# Patient Record
Sex: Male | Born: 1970 | Race: White | Hispanic: No | Marital: Married | Smoking: Never smoker
Health system: Southern US, Community
[De-identification: ages and names within clinical notes are randomized; demographics above are authoritative.]

## PROBLEM LIST (undated history)

## (undated) DIAGNOSIS — F909 Attention-deficit hyperactivity disorder, unspecified type: Secondary | ICD-10-CM

## (undated) HISTORY — DX: Attention-deficit hyperactivity disorder, unspecified type: F90.9

---

## 2017-09-07 DIAGNOSIS — F900 Attention-deficit hyperactivity disorder, predominantly inattentive type: Secondary | ICD-10-CM | POA: Diagnosis not present

## 2017-09-07 DIAGNOSIS — F411 Generalized anxiety disorder: Secondary | ICD-10-CM | POA: Diagnosis not present

## 2017-09-19 MED FILL — FLUoxetine HCL 20 MG CAPS: 20 | 90 days supply | Qty: 90 | Fill #0

## 2017-09-19 MED FILL — DEXTROAMP-AMPHETAMIN 20 MG: 20 | 30 days supply | Qty: 60 | Fill #0

## 2017-11-18 MED FILL — DEXTROAMP-AMPHETAMIN 20 MG: 20 | 30 days supply | Qty: 60 | Fill #0

## 2017-12-26 MED FILL — DEXTROAMP-AMPHETAMIN 20 MG: 20 | 30 days supply | Qty: 60 | Fill #0

## 2017-12-27 MED FILL — FLUoxetine HCL 20 MG CAPS: 20 | 90 days supply | Qty: 90 | Fill #1

## 2018-03-07 MED FILL — DEXTROAMP-AMPHETAMIN 20 MG: 20 | 30 days supply | Qty: 60 | Fill #0

## 2018-04-07 MED FILL — DEXTROAMP-AMPHETAMIN 20 MG: 20 | 30 days supply | Qty: 60 | Fill #0

## 2018-05-08 MED FILL — DEXTROAMP-AMPHETAMIN 20 MG: 20 | 30 days supply | Qty: 60 | Fill #0

## 2018-06-06 DIAGNOSIS — F411 Generalized anxiety disorder: Secondary | ICD-10-CM | POA: Diagnosis not present

## 2018-06-06 DIAGNOSIS — F900 Attention-deficit hyperactivity disorder, predominantly inattentive type: Secondary | ICD-10-CM | POA: Diagnosis not present

## 2018-06-09 MED FILL — DEXTROAMP-AMPHETAMIN 20 MG: 20 | 30 days supply | Qty: 60 | Fill #0

## 2018-07-11 MED FILL — AMPHETAMINE-DEXTROAMPHETAMI: 20 | 30 days supply | Qty: 60 | Fill #0

## 2018-09-22 ENCOUNTER — Encounter: Payer: Self-pay | Admitting: Physician Assistant

## 2018-09-22 ENCOUNTER — Other Ambulatory Visit: Payer: Self-pay

## 2018-09-22 ENCOUNTER — Ambulatory Visit (INDEPENDENT_AMBULATORY_CARE_PROVIDER_SITE_OTHER): Payer: 59 | Admitting: Physician Assistant

## 2018-09-22 VITALS — BP 162/88 | HR 93 | Temp 98.5°F | Resp 16 | Ht 74.0 in | Wt 211.0 lb

## 2018-09-22 DIAGNOSIS — Z136 Encounter for screening for cardiovascular disorders: Secondary | ICD-10-CM | POA: Diagnosis not present

## 2018-09-22 DIAGNOSIS — I10 Essential (primary) hypertension: Secondary | ICD-10-CM | POA: Insufficient documentation

## 2018-09-22 DIAGNOSIS — E663 Overweight: Secondary | ICD-10-CM | POA: Diagnosis not present

## 2018-09-22 DIAGNOSIS — Z13220 Encounter for screening for lipoid disorders: Secondary | ICD-10-CM | POA: Diagnosis not present

## 2018-09-22 DIAGNOSIS — F909 Attention-deficit hyperactivity disorder, unspecified type: Secondary | ICD-10-CM | POA: Insufficient documentation

## 2018-09-22 DIAGNOSIS — Z0001 Encounter for general adult medical examination with abnormal findings: Secondary | ICD-10-CM

## 2018-09-22 MED ORDER — AMLODIPINE BESYLATE 5 MG PO TABS: 5.0000 mg | ORAL_TABLET | Freq: Every day | ORAL | 0 refills | Status: DC

## 2018-09-22 MED FILL — AMLODIPINE BESYLATE 5 MG TA: 5 | 30 days supply | Qty: 30 | Fill #0

## 2018-09-22 NOTE — Progress Notes (Signed)
Subjective:    Robert [name] is a 48 y.o. male and is here for a comprehensive physical exam.  HPI  Health Maintenance Due  Topic Date Due  . HIV Screening  01/23/1986    Acute Concerns: Elevated blood pressure -- Currently taking not on medications. At home blood pressure readings are: 158/96. Patient denies chest pain, SOB, blurred vision, dizziness, unusual headaches, lower leg swelling. Denies excessive caffeine intake, excessive alcohol intake, or increase in salt consumption. He does take Adderall 10 mg BID. Significant fam hx of HTN, stroke, MI.  Chronic Issues: ADHD -- taking Adderall 10 mg BID. Managed by [facility].  Health Maintenance: Immunizations -- UTD Colonoscopy -- n/a PSA -- n/a Diet -- well balanced Caffeine intake -- 1-2 a day Exercise -- runs a few times a week as knee allows, strength training Weight -- Weight: 211 lb (95.7 kg)  Weight history Wt Readings from Last 10 Encounters:  09/22/18 211 lb (95.7 kg)   Mood -- good Tobacco use -- none Alcohol use --- socially, no concern for intake  Depression screen PHQ 2/9 09/22/2018  Decreased Interest 0  Down, Depressed, Hopeless 0  PHQ - 2 Score 0     Other providers/specialists: [name], [name]   PMHx, SurgHx, SocialHx, Medications, and Allergies were reviewed in the Visit Navigator and updated as appropriate.   History reviewed. No pertinent past medical history.  History reviewed. No pertinent surgical history.  History reviewed. No pertinent family history.  Social History   Tobacco Use  . Smoking status: Never Smoker  . Smokeless tobacco: Never Used  Substance Use Topics  . Alcohol use: Not on file  . Drug use: Not on file    Review of Systems:   Review of Systems  Constitutional: Negative for chills, fever, malaise/fatigue and weight loss.  HENT: Negative for hearing loss, sinus pain and sore throat.    Respiratory: Negative for cough and hemoptysis.   Cardiovascular: Negative for chest pain, palpitations, leg swelling and PND.  Gastrointestinal: Negative for abdominal pain, constipation, diarrhea, heartburn, nausea and vomiting.  Genitourinary: Negative for dysuria, frequency and urgency.  Musculoskeletal: Negative for back pain, myalgias and neck pain.  Skin: Negative for itching and rash.  Neurological: Negative for dizziness, tingling, seizures and headaches.  Endo/Heme/Allergies: Negative for polydipsia.  Psychiatric/Behavioral: Negative for depression. The patient is not nervous/anxious.     Objective:   Vitals:   09/22/18 1446  BP: (!) 162/88  Pulse: 93  Resp: 16  Temp: 98.5 F (36.9 C)  SpO2: 97%   Body mass index is 27.09 kg/m.  General Appearance:  Alert, cooperative, no distress, appears stated age  Head:  Normocephalic, without obvious abnormality, atraumatic  Eyes:  PERRL, conjunctiva/corneas clear, EOM's intact, fundi benign, both eyes       Ears:  Normal TM's and external ear canals, both ears  Nose: Nares normal, septum midline, mucosa normal, no drainage    or sinus tenderness  Throat: Lips, mucosa, and tongue normal; teeth and gums normal  Neck: Supple, symmetrical, trachea midline, no adenopathy; thyroid:  No enlargement/tenderness/nodules; no carotit bruit or JVD  Back:   Symmetric, no curvature, ROM normal, no CVA tenderness  Lungs:   Clear to auscultation bilaterally, respirations unlabored  Chest wall:  No tenderness or deformity  Heart:  Regular rate and rhythm, S1 and S2 normal, no murmur, rub   or gallop  Abdomen:   Soft,  non-tender, bowel sounds active all four quadrants, no masses, no organomegaly  Extremities: Extremities normal, atraumatic, no cyanosis or edema  Prostate: Not done.   Skin: Skin color, texture, turgor normal, no rashes or lesions  Lymph nodes: Cervical, supraclavicular, and axillary nodes normal  Neurologic: CNII-XII grossly  intact. Normal strength, sensation and reflexes throughout   No results found for this or any previous visit.  Assessment/Plan:   Robert [name] was seen today for establish care, annual exam, prehypertension and adhd.  Diagnoses and all orders for this visit:  Encounter for general adult medical examination with abnormal findings Today patient counseled on age appropriate routine health concerns for screening and prevention, each reviewed and up to date or declined. Immunizations reviewed and up to date or declined. Labs ordered and reviewed. Risk factors for depression reviewed and negative. Hearing function and visual acuity are intact. ADLs screened and addressed as needed. Functional ability and level of safety reviewed and appropriate. Education, counseling and referrals performed based on assessed risks today. Patient provided with a copy of personalized plan for preventive services.  Hypertension, unspecified type Uncontrolled. Start Norvasc 5mg . Start BP log. Follow-up in 2 weeks, sooner if concerns. -     CBC with Differential/Platelet; Future -     Comprehensive metabolic panel; Future -     TSH; Future  Attention deficit hyperactivity disorder (ADHD), unspecified ADHD type Stable per patient. May need to consider adjustment (per provider) if BP issues persist despite anti-HTN treatment.  Overweight Continue to work on increasing exercise and better food choices.  Encounter for lipid screening for cardiovascular disease -     Cancel: Lipid panel -     Lipid panel; Future    Well Adult Exam: Labs ordered: Yes. Patient counseling was done. See below for items discussed. Discussed the patient's BMI.  The BMI is not in the acceptable range; BMI management plan is completed Follow up in 2 weeks.  Patient Counseling: [x]   Nutrition: Stressed importance of moderation in sodium/caffeine intake, saturated fat and cholesterol, caloric balance, sufficient intake of fresh fruits,  vegetables, and fiber.  [x]   Stressed the importance of regular exercise.   []   Substance Abuse: Discussed cessation/primary prevention of tobacco, alcohol, or other drug use; driving or other dangerous activities under the influence; availability of treatment for abuse.   [x]   Injury prevention: Discussed safety belts, safety helmets, smoke detector, smoking near bedding or upholstery.   []   Sexuality: Discussed sexually transmitted diseases, partner selection, use of condoms, avoidance of unintended pregnancy  and contraceptive alternatives.   [x]   Dental health: Discussed importance of regular tooth brushing, flossing, and dental visits.  [x]   Health maintenance and immunizations reviewed. Please refer to Health maintenance section.   Inda Coke, PA-C [place]

## 2018-09-22 NOTE — Patient Instructions (Addendum)
It was great to see you!  Please go to the lab for blood work.   Follow-up with me in TWO weeks for blood pressure. Keep a log. Start Norvasc tomorrow.  Our office will call you with your results unless you have chosen to receive results via MyChart.  If your blood work is normal we will follow-up each year for physicals and as scheduled for chronic medical problems.  If anything is abnormal we will treat accordingly and get you in for a follow-up.  Take care,  [facility] Maintenance, Male Adopting a healthy lifestyle and getting preventive care are important in promoting health and wellness. Ask your health care provider about:  The right schedule for you to have regular tests and exams.  Things you can do on your own to prevent diseases and keep yourself healthy. What should I know about diet, weight, and exercise? Eat a healthy diet   Eat a diet that includes plenty of vegetables, fruits, low-fat dairy products, and lean protein.  Do not eat a lot of foods that are high in solid fats, added sugars, or sodium. Maintain a healthy weight Body mass index (BMI) is a measurement that can be used to identify possible weight problems. It estimates body fat based on height and weight. Your health care provider can help determine your BMI and help you achieve or maintain a healthy weight. Get regular exercise Get regular exercise. This is one of the most important things you can do for your health. Most adults should:  Exercise for at least 150 minutes each week. The exercise should increase your heart rate and make you sweat (moderate-intensity exercise).  Do strengthening exercises at least twice a week. This is in addition to the moderate-intensity exercise.  Spend less time sitting. Even light physical activity can be beneficial. Watch cholesterol and blood lipids Have your blood tested for lipids and cholesterol at 49 years of age, then have this test every 5 years.  You may need to have your cholesterol levels checked more often if:  Your lipid or cholesterol levels are high.  You are older than 49 years of age.  You are at high risk for heart disease. What should I know about cancer screening? Many types of cancers can be detected early and may often be prevented. Depending on your health history and family history, you may need to have cancer screening at various ages. This may include screening for:  Colorectal cancer.  Prostate cancer.  Skin cancer.  Lung cancer. What should I know about heart disease, diabetes, and high blood pressure? Blood pressure and heart disease  High blood pressure causes heart disease and increases the risk of stroke. This is more likely to develop in people who have high blood pressure readings, are of African descent, or are overweight.  Talk with your health care provider about your target blood pressure readings.  Have your blood pressure checked: ? Every 3-5 years if you are 17-13 years of age. ? Every year if you are 50 years old or older.  If you are between the ages of 40 and 43 and are a current or former smoker, ask your health care provider if you should have a one-time screening for abdominal aortic aneurysm (AAA). Diabetes Have regular diabetes screenings. This checks your fasting blood sugar level. Have the screening done:  Once every three years after age 45 if you are at a normal weight and have a low risk for diabetes.  More often and at a younger age if you are overweight or have a high risk for diabetes. What should I know about preventing infection? Hepatitis B If you have a higher risk for hepatitis B, you should be screened for this virus. Talk with your health care provider to find out if you are at risk for hepatitis B infection. Hepatitis C Blood testing is recommended for:  Everyone born from [birth year] through 1966.  Anyone with known risk factors for hepatitis C. Sexually  transmitted infections (STIs)  You should be screened each year for STIs, including gonorrhea and chlamydia, if: ? You are sexually active and are younger than 49 years of age. ? You are older than 49 years of age and your health care provider tells you that you are at risk for this type of infection. ? Your sexual activity has changed since you were last screened, and you are at increased risk for chlamydia or gonorrhea. Ask your health care provider if you are at risk.  Ask your health care provider about whether you are at high risk for HIV. Your health care provider may recommend a prescription medicine to help prevent HIV infection. If you choose to take medicine to prevent HIV, you should first get tested for HIV. You should then be tested every 3 months for as long as you are taking the medicine. Follow these instructions at home: Lifestyle  Do not use any products that contain nicotine or tobacco, such as cigarettes, e-cigarettes, and chewing tobacco. If you need help quitting, ask your health care provider.  Do not use street drugs.  Do not share needles.  Ask your health care provider for help if you need support or information about quitting drugs. Alcohol use  Do not drink alcohol if your health care provider tells you not to drink.  If you drink alcohol: ? Limit how much you have to 0-2 drinks a day. ? Be aware of how much alcohol is in your drink. In the [place] one drink equals one 12 oz bottle of beer (355 mL), one 5 oz glass of wine (148 mL), or one 1 oz glass of hard liquor (44 mL). General instructions  Schedule regular health, dental, and eye exams.  Stay current with your vaccines.  Tell your health care provider if: ? You often feel depressed. ? You have ever been abused or do not feel safe at home. Summary  Adopting a healthy lifestyle and getting preventive care are important in promoting health and wellness.  Follow your health care provider's  instructions about healthy diet, exercising, and getting tested or screened for diseases.  Follow your health care provider's instructions on monitoring your cholesterol and blood pressure. This information is not intended to replace advice given to you by your health care provider. Make sure you discuss any questions you have with your health care provider. Document Released: 08/28/2007 Document Revised: 02/22/2018 Document Reviewed: 02/22/2018 Elsevier Patient Education  2020 [employer].

## 2018-09-23 LAB — COMPREHENSIVE METABOLIC PANEL
AG Ratio: 1.9 (calc) (ref 1.0–2.5)
ALT: 43 U/L (ref 9–46)
AST: 34 U/L (ref 10–40)
Albumin: 4.6 g/dL (ref 3.6–5.1)
Alkaline phosphatase (APISO): 41 U/L (ref 36–130)
BUN: 17 mg/dL (ref 7–25)
CO2: 28 mmol/L (ref 20–32)
Calcium: 9.7 mg/dL (ref 8.6–10.3)
Chloride: 103 mmol/L (ref 98–110)
Creat: 1.07 mg/dL (ref 0.60–1.35)
Globulin: 2.4 g/dL (calc) (ref 1.9–3.7)
Glucose, Bld: 101 mg/dL — ABNORMAL HIGH (ref 65–99)
Potassium: 3.6 mmol/L (ref 3.5–5.3)
Sodium: 140 mmol/L (ref 135–146)
Total Bilirubin: 0.6 mg/dL (ref 0.2–1.2)
Total Protein: 7 g/dL (ref 6.1–8.1)

## 2018-09-23 LAB — CBC WITH DIFFERENTIAL/PLATELET
Absolute Monocytes: 674 cells/uL (ref 200–950)
Basophils Absolute: 32 cells/uL (ref 0–200)
Basophils Relative: 0.5 %
Eosinophils Absolute: 113 cells/uL (ref 15–500)
Eosinophils Relative: 1.8 %
HCT: 45.8 % (ref 38.5–50.0)
Hemoglobin: 15.8 g/dL (ref 13.2–17.1)
Lymphs Abs: 1046 cells/uL (ref 850–3900)
MCH: 32.7 pg (ref 27.0–33.0)
MCHC: 34.5 g/dL (ref 32.0–36.0)
MCV: 94.8 fL (ref 80.0–100.0)
MPV: 12 fL (ref 7.5–12.5)
Monocytes Relative: 10.7 %
Neutro Abs: 4435 cells/uL (ref 1500–7800)
Neutrophils Relative %: 70.4 %
Platelets: 213 10*3/uL (ref 140–400)
RBC: 4.83 10*6/uL (ref 4.20–5.80)
RDW: 13.1 % (ref 11.0–15.0)
Total Lymphocyte: 16.6 %
WBC: 6.3 10*3/uL (ref 3.8–10.8)

## 2018-09-23 LAB — LIPID PANEL
Cholesterol: 199 mg/dL (ref <200)
HDL: 39 mg/dL — ABNORMAL LOW (ref >=40)
LDL Cholesterol (Calc): 131 mg/dL (calc) — ABNORMAL HIGH
Non-HDL Cholesterol (Calc): 160 mg/dL (calc) — ABNORMAL HIGH (ref <130)
Total CHOL/HDL Ratio: 5.1 (calc) — ABNORMAL HIGH (ref <5.0)
Triglycerides: 159 mg/dL — ABNORMAL HIGH (ref <150)

## 2018-09-23 LAB — TSH: TSH: 2.05 mIU/L (ref 0.40–4.50)

## 2018-09-24 ENCOUNTER — Encounter: Payer: Self-pay | Admitting: Physician Assistant

## 2018-09-26 DIAGNOSIS — F411 Generalized anxiety disorder: Secondary | ICD-10-CM | POA: Diagnosis not present

## 2018-09-26 DIAGNOSIS — F900 Attention-deficit hyperactivity disorder, predominantly inattentive type: Secondary | ICD-10-CM | POA: Diagnosis not present

## 2018-09-28 MED FILL — AMPHETAMINE-DEXTROAMPHETAMI: 20 | 30 days supply | Qty: 60 | Fill #0

## 2018-10-06 ENCOUNTER — Other Ambulatory Visit: Payer: Self-pay

## 2018-10-06 ENCOUNTER — Encounter: Payer: Self-pay | Admitting: Physician Assistant

## 2018-10-06 ENCOUNTER — Ambulatory Visit: Payer: 59 | Admitting: Physician Assistant

## 2018-10-06 VITALS — BP 142/90 | HR 88 | Temp 98.4°F | Ht 74.0 in | Wt 210.0 lb

## 2018-10-06 DIAGNOSIS — I10 Essential (primary) hypertension: Secondary | ICD-10-CM | POA: Diagnosis not present

## 2018-10-06 MED ORDER — AMLODIPINE BESYLATE 10 MG PO TABS: 10.0000 mg | ORAL_TABLET | Freq: Every day | ORAL | 1 refills | Status: DC

## 2018-10-06 NOTE — Progress Notes (Signed)
Robert [name] is a 48 y.o. male is for follow up on HTN  I acted as a Education administrator for [employer], PA-C Anselmo Pickler, LPN  History of Present Illness:   Chief Complaint  Patient presents with  . Hypertension    HPI   Hypertension Pt for follow up today for blood pressure. He has not been checking blood pressure at home has a monitor on order. Currently taking Amlodipine 5 mg daily. Pt denies headaches, dizziness, blurred vision, chest pain, SOB. Pt noted LLE on Sunday night after moving all weekend.Pt elevated legs and was gone by moring.  Denies excessive caffeine intake, stimulant usage, excessive alcohol intake or increase in salt consumption.  BP Readings from Last 3 Encounters:  10/06/18 (!) 142/90  09/22/18 (!) 162/88     Health Maintenance Due  Topic Date Due  . HIV Screening  01/23/1986    Past Medical History:  Diagnosis Date  . ADHD      Social History   Socioeconomic History  . Marital status: Married    Spouse name: Not on file  . Number of children: Not on file  . Years of education: Not on file  . Highest education level: Not on file  Occupational History  . Not on file  Social Needs  . Financial resource strain: Not on file  . Food insecurity    Worry: Not on file    Inability: Not on file  . Transportation needs    Medical: Not on file    Non-medical: Not on file  Tobacco Use  . Smoking status: Never Smoker  . Smokeless tobacco: Never Used  Substance and Sexual Activity  . Alcohol use: Yes  . Drug use: Never  . Sexual activity: Yes    Partners: Female  Lifestyle  . Physical activity    Days per week: Not on file    Minutes per session: Not on file  . Stress: Not on file  Relationships  . Social Herbalist on phone: Not on file    Gets together: Not on file    Attends religious service: Not on file    Active member of club or organization: Not on file    Attends meetings of clubs or organizations: Not on file     Relationship status: Not on file  . Intimate partner violence    Fear of current or ex partner: Not on file    Emotionally abused: Not on file    Physically abused: Not on file    Forced sexual activity: Not on file  Other Topics Concern  . Not on file  Social History Narrative  . Not on file    History reviewed. No pertinent surgical history.  Family History  Problem Relation Age of Onset  . High blood pressure Mother   . Stroke Mother   . High blood pressure Father   . Heart disease Father   . Heart disease Maternal Grandfather   . High blood pressure Maternal Grandfather   . Heart disease Paternal Grandfather   . High blood pressure Paternal Grandfather     PMHx, SurgHx, SocialHx, FamHx, Medications, and Allergies were reviewed in the Visit Navigator and updated as appropriate.   Patient Active Problem List   Diagnosis Date Noted  . Hypertension 09/22/2018  . Attention deficit hyperactivity disorder (ADHD) 09/22/2018    Social History   Tobacco Use  . Smoking status: Never Smoker  . Smokeless tobacco: Never Used  Substance Use Topics  . Alcohol use: Yes  . Drug use: Never    Current Medications and Allergies:    Current Outpatient Medications:  .  amphetamine-dextroamphetamine (ADDERALL) 10 MG tablet, Take 1 tablet by mouth 2 (two) times a day. , Disp: , Rfl:  .  amLODipine (NORVASC) 10 MG tablet, Take 1 tablet (10 mg total) by mouth daily., Disp: 30 tablet, Rfl: 1  No Known Allergies  Review of Systems   ROS Negative unless otherwise specified per HPI.  Vitals:   Vitals:   10/06/18 1424 10/06/18 1611  BP: 138/90 (!) 142/90  Pulse: 88   Temp: 98.4 F (36.9 C)   TempSrc: Oral   SpO2: 98%   Weight: 210 lb (95.3 kg)   Height: 6\' 2"  (1.88 m)      Body mass index is 26.96 kg/m.   Physical Exam:    Physical Exam Vitals signs and nursing note reviewed.  Constitutional:      General: He is not in acute distress.    Appearance: He is  well-developed. He is not ill-appearing or toxic-appearing.  Cardiovascular:     Rate and Rhythm: Normal rate and regular rhythm.     Pulses: Normal pulses.     Heart sounds: Normal heart sounds, S1 normal and S2 normal.     Comments: No LE edema Pulmonary:     Effort: Pulmonary effort is normal.     Breath sounds: Normal breath sounds.  Skin:    General: Skin is warm and dry.  Neurological:     Mental Status: He is alert.     GCS: GCS eye subscore is 4. GCS verbal subscore is 5. GCS motor subscore is 6.  Psychiatric:        Speech: Speech normal.        Behavior: Behavior normal. Behavior is cooperative.      Assessment and Plan:    Jmarion was seen today for hypertension.  Diagnoses and all orders for this visit:  Hypertension, unspecified type  Other orders -     amLODipine (NORVASC) 10 MG tablet; Take 1 tablet (10 mg total) by mouth daily.   Uncontrolled, and he states that he didn't take his adderall yet today. Will increase Norvasc to 10 mg. Follow-up in 2-4 weeks. Keep BP log. Follow-up sooner if sx develop.  . Reviewed expectations re: course of current medical issues. . Discussed self-management of symptoms. . Outlined signs and symptoms indicating need for more acute intervention. . Patient verbalized understanding and all questions were answered. . See orders for this visit as documented in the electronic medical record. . Patient received an After Visit Summary.  CMA or LPN served as scribe during this visit. History, Physical, and Plan performed by medical provider. The above documentation has been reviewed and is accurate and complete.   Inda Coke, PA-C [place] [place] 10/06/2018  Follow-up: No follow-ups on file.

## 2018-10-06 NOTE — Patient Instructions (Signed)
It was great to see you!  Increase Norvasc to 10 mg daily.  Follow-up in 2-4 weeks. Please keep track of your blood pressure if you can. See me sooner if concerns.  Take care,  Inda Coke PA-C

## 2018-10-27 ENCOUNTER — Ambulatory Visit: Payer: 59 | Admitting: Physician Assistant

## 2018-11-03 ENCOUNTER — Ambulatory Visit (INDEPENDENT_AMBULATORY_CARE_PROVIDER_SITE_OTHER): Payer: 59 | Admitting: Physician Assistant

## 2018-11-03 ENCOUNTER — Encounter: Payer: Self-pay | Admitting: Physician Assistant

## 2018-11-03 DIAGNOSIS — I10 Essential (primary) hypertension: Secondary | ICD-10-CM | POA: Diagnosis not present

## 2018-11-03 MED ORDER — LISINOPRIL-HYDROCHLOROTHIAZIDE 10-12.5 MG PO TABS: 1.0000 | ORAL_TABLET | Freq: Every day | ORAL | 1 refills | Status: DC

## 2018-11-03 MED FILL — LISINOPRIL-HCTZ 10-12.5 MG: 10-12.5 | 30 days supply | Qty: 30 | Fill #0

## 2018-11-03 NOTE — Progress Notes (Signed)
Virtual Visit via Video   I connected with Robert [name] on 11/03/18 at  1:00 PM EDT by a video enabled telemedicine application and verified that I am speaking with the correct person using two identifiers. Location patient: Home Location provider: [facility], Office Persons participating in the virtual visit: Robert [name], [place] PA-C.  I discussed the limitations of evaluation and management by telemedicine and the availability of in person appointments. The patient expressed understanding and agreed to proceed.  I acted as a Education administrator for [employer], [employer], LPN  Subjective:   HPI:   Hypertension Pt following up today on blood pressure. Pt has been checking blood pressure at home. It has been averaging systolic [id number], diastolic [id number]. He stopped his blood pressure medicine Amlodipine 10 mg 2 weeks ago due to lower leg edema. Edema is gone now. Pt denies headaches, dizziness, blurred vision, chest pain, SOB. Denies excessive caffeine intake, excessive alcohol intake or increase in salt consumption.   ROS: See pertinent positives and negatives per HPI.  Patient Active Problem List   Diagnosis Date Noted  . Hypertension 09/22/2018  . Attention deficit hyperactivity disorder (ADHD) 09/22/2018    Social History   Tobacco Use  . Smoking status: Never Smoker  . Smokeless tobacco: Never Used  Substance Use Topics  . Alcohol use: Yes    Current Outpatient Medications:  .  amphetamine-dextroamphetamine (ADDERALL) 10 MG tablet, Take 1 tablet by mouth 2 (two) times a day. , Disp: , Rfl:  .  amLODipine (NORVASC) 10 MG tablet, Take 1 tablet (10 mg total) by mouth daily. (Patient not taking: Reported on 11/03/2018), Disp: 30 tablet, Rfl: 1 .  lisinopril-hydrochlorothiazide (ZESTORETIC) 10-12.5 MG tablet, Take 1 tablet by mouth daily., Disp: 30 tablet, Rfl: 1  No Known Allergies  Objective:   VITALS: Per patient if applicable,  see vitals. GENERAL: Alert, appears well and in no acute distress. HEENT: Atraumatic, conjunctiva clear, no obvious abnormalities on inspection of external nose and ears. NECK: Normal movements of the head and neck. CARDIOPULMONARY: No increased WOB. Speaking in clear sentences. I:E ratio WNL.  MS: Moves all visible extremities without noticeable abnormality. PSYCH: Pleasant and cooperative, well-groomed. Speech normal rate and rhythm. Affect is appropriate. Insight and judgement are appropriate. Attention is focused, linear, and appropriate.  NEURO: CN grossly intact. Oriented as arrived to appointment on time with no prompting. Moves both UE equally.  SKIN: No obvious lesions, wounds, erythema, or cyanosis noted on face or hands.  Assessment and Plan:   Robert [name] was seen today for hypertension.  Diagnoses and all orders for this visit:  Hypertension, unspecified type  Other orders -     lisinopril-hydrochlorothiazide (ZESTORETIC) 10-12.5 MG tablet; Take 1 tablet by mouth daily.   Uncontrolled. Could not tolerated norvasc due to swelling. Will start oral lisinopril-hctz. Follow-up in 2-4 weeks -- sooner if needed. Keep blood pressure log in the interim.  . Reviewed expectations re: course of current medical issues. . Discussed self-management of symptoms. . Outlined signs and symptoms indicating need for more acute intervention. . Patient verbalized understanding and all questions were answered. Marland Kitchen Health Maintenance issues including appropriate healthy diet, exercise, and smoking avoidance were discussed with patient. . See orders for this visit as documented in the electronic medical record.  I discussed the assessment and treatment plan with the patient. The patient was provided an opportunity to ask questions and all were answered. The patient agreed with the plan and demonstrated  an understanding of the instructions.   The patient was advised to call back or seek an in-person  evaluation if the symptoms worsen or if the condition fails to improve as anticipated.   CMA or LPN served as scribe during this visit. History, Physical, and Plan performed by medical provider. The above documentation has been reviewed and is accurate and complete.  Robert [name], [place] 11/03/2018

## 2018-11-06 MED FILL — FLUoxetine HCL 20 MG CAPS: 20 | 90 days supply | Qty: 90 | Fill #0

## 2018-11-06 MED FILL — AMPHETAMINE-DEXTROAMPHETAMI: 20 | 30 days supply | Qty: 60 | Fill #0

## 2018-12-01 DIAGNOSIS — H524 Presbyopia: Secondary | ICD-10-CM | POA: Diagnosis not present

## 2018-12-08 MED FILL — AMPHETAMINE-DEXTROAMPHETAMI: 20 | 30 days supply | Qty: 60 | Fill #0

## 2019-02-14 DIAGNOSIS — F411 Generalized anxiety disorder: Secondary | ICD-10-CM | POA: Diagnosis not present

## 2019-02-14 DIAGNOSIS — F329 Major depressive disorder, single episode, unspecified: Secondary | ICD-10-CM | POA: Diagnosis not present

## 2019-02-14 DIAGNOSIS — F902 Attention-deficit hyperactivity disorder, combined type: Secondary | ICD-10-CM | POA: Diagnosis not present

## 2019-02-14 MED FILL — AMPHETAMINE-DEXTROAMPHETAMI: 20 | 90 days supply | Qty: 180 | Fill #0

## 2019-03-27 ENCOUNTER — Encounter: Payer: Self-pay | Admitting: Physician Assistant

## 2019-04-23 MED FILL — LISINOPRIL-HCTZ 10-12.5 MG: 10-12.5 | 30 days supply | Qty: 30 | Fill #1

## 2019-05-07 MED FILL — AMPHETAMINE-DEXTROAMPHETAMI: 20 | 30 days supply | Qty: 60 | Fill #0

## 2019-05-28 ENCOUNTER — Other Ambulatory Visit: Payer: Self-pay | Admitting: Physician Assistant

## 2019-05-28 ENCOUNTER — Encounter: Payer: Self-pay | Admitting: Physician Assistant

## 2019-05-28 MED FILL — LISINOPRIL-HCTZ 10-12.5 MG: 10-12.5 | 60 days supply | Qty: 60 | Fill #0

## 2019-07-03 MED FILL — VYVANSE 30 MG CAPSULE: 30 | 30 days supply | Qty: 30 | Fill #0

## 2019-07-27 ENCOUNTER — Other Ambulatory Visit: Payer: Self-pay | Admitting: Physician Assistant

## 2019-07-30 MED FILL — VYVANSE 40 MG CAPSULE: 40 | 30 days supply | Qty: 30 | Fill #0

## 2019-09-07 MED FILL — VYVANSE 40 MG CAPSULE: 40 | 30 days supply | Qty: 30 | Fill #0

## 2019-10-09 ENCOUNTER — Other Ambulatory Visit: Payer: Self-pay | Admitting: *Deleted

## 2019-10-09 MED ORDER — LISINOPRIL-HYDROCHLOROTHIAZIDE 10-12.5 MG PO TABS: 1.0000 | ORAL_TABLET | Freq: Every day | ORAL | 0 refills | Status: DC

## 2019-10-09 MED FILL — VYVANSE 40 MG CAPSULE: 40 | 90 days supply | Qty: 90 | Fill #0

## 2019-10-09 MED FILL — LISINOPRIL-HCTZ 10-12.5 MG: 10-12.5 | 30 days supply | Qty: 30 | Fill #0

## 2019-12-14 DIAGNOSIS — H524 Presbyopia: Secondary | ICD-10-CM | POA: Diagnosis not present

## 2019-12-17 ENCOUNTER — Other Ambulatory Visit (HOSPITAL_COMMUNITY): Payer: Self-pay | Admitting: Adult Health

## 2019-12-18 ENCOUNTER — Other Ambulatory Visit: Payer: Self-pay | Admitting: Physician Assistant

## 2019-12-18 MED FILL — LISINOPRIL-HCTZ 10-12.5 MG: 10-12.5 | 30 days supply | Qty: 30 | Fill #0

## 2020-01-04 MED FILL — VYVANSE 40 MG CAPSULE: 40 | 90 days supply | Qty: 90 | Fill #0

## 2020-02-27 ENCOUNTER — Other Ambulatory Visit (HOSPITAL_COMMUNITY): Payer: Self-pay | Admitting: Adult Health

## 2020-02-27 MED FILL — FLUoxetine HCL 20 MG CAPS: 20 | 90 days supply | Qty: 90 | Fill #0

## 2020-03-19 ENCOUNTER — Other Ambulatory Visit: Payer: Self-pay | Admitting: Adult Health

## 2020-03-19 DIAGNOSIS — F909 Attention-deficit hyperactivity disorder, unspecified type: Secondary | ICD-10-CM

## 2020-03-19 MED ORDER — LISDEXAMFETAMINE DIMESYLATE 40 MG PO CAPS: 40.0000 mg | ORAL_CAPSULE | Freq: Every day | ORAL | 0 refills | Status: DC

## 2020-03-21 ENCOUNTER — Ambulatory Visit: Payer: 59 | Admitting: Physician Assistant

## 2020-03-21 ENCOUNTER — Ambulatory Visit (INDEPENDENT_AMBULATORY_CARE_PROVIDER_SITE_OTHER): Payer: 59

## 2020-03-21 ENCOUNTER — Encounter: Payer: Self-pay | Admitting: Physician Assistant

## 2020-03-21 ENCOUNTER — Other Ambulatory Visit: Payer: Self-pay

## 2020-03-21 VITALS — BP 140/90 | HR 81 | Temp 97.8°F | Ht 74.0 in | Wt 211.4 lb

## 2020-03-21 DIAGNOSIS — R0781 Pleurodynia: Secondary | ICD-10-CM

## 2020-03-21 NOTE — Progress Notes (Signed)
Robert [name] is a 50 y.o. male here for a new problem.  I acted as a Education administrator for [employer], PA-C Anselmo Pickler, LPN   History of Present Illness:   Chief Complaint  Patient presents with  . Back Pain  . Fall    HPI  Back pain About 9 days ago he got a pull-up bar, was trying to use and fell from his height down onto his floor. He is having pain in his mid back left side, thinks he may have fractured rib.  Overall, symptoms are improving with time. He did have a coughing fit on Wednesday and during this time he thinks he exacerbated his pain. He made his appointment then. However, since that time his symptoms have improved with time.  He has taken Tylenol and used heat. Current pain is 1/10 pain.    Past Medical History:  Diagnosis Date  . ADHD      Social History   Tobacco Use  . Smoking status: Never Smoker  . Smokeless tobacco: Never Used  Substance Use Topics  . Alcohol use: Yes  . Drug use: Never    History reviewed. No pertinent surgical history.  Family History  Problem Relation Age of Onset  . High blood pressure Mother   . Stroke Mother   . High blood pressure Father   . Heart disease Father   . Heart disease Maternal Grandfather   . High blood pressure Maternal Grandfather   . Heart disease Paternal Grandfather   . High blood pressure Paternal Grandfather     No Known Allergies  Current Medications:   Current Outpatient Medications:  .  lisdexamfetamine (VYVANSE) 40 MG capsule, Take 1 capsule (40 mg total) by mouth daily., Disp: 90 capsule, Rfl: 0 .  lisinopril-hydrochlorothiazide (ZESTORETIC) 10-12.5 MG tablet, TAKE 1 TABLET BY MOUTH DAILY., Disp: 30 tablet, Rfl: 0   Review of Systems:   ROS Negative unless otherwise specified per HPI.  Vitals:   Vitals:   03/21/20 1510  BP: 140/90  Pulse: 81  Temp: 97.8 F (36.6 C)  TempSrc: Temporal  SpO2: 97%  Weight: 211 lb 6.1 oz (95.9 kg)  Height: 6\' 2"  (1.88 m)     Body mass  index is 27.14 kg/m.  Physical Exam:   Physical Exam Vitals and nursing note reviewed.  Constitutional:      General: He is not in acute distress.    Appearance: He is well-developed. He is not ill-appearing, toxic-appearing or sickly-appearing.  Cardiovascular:     Rate and Rhythm: Normal rate and regular rhythm.     Pulses: Normal pulses.     Heart sounds: Normal heart sounds, S1 normal and S2 normal.     Comments: No LE edema Pulmonary:     Effort: Pulmonary effort is normal.     Breath sounds: Normal breath sounds.  Musculoskeletal:     Comments: Pain with palpation to L posterior lower rib in thoracic area, no palpable deformity  Skin:    General: Skin is warm, dry and intact.  Neurological:     Mental Status: He is alert.     GCS: GCS eye subscore is 4. GCS verbal subscore is 5. GCS motor subscore is 6.  Psychiatric:        Mood and Affect: Mood and affect normal.        Speech: Speech normal.        Behavior: Behavior normal. Behavior is cooperative.     Assessment and Plan:  Oleg was seen today for back pain and fall.  Diagnoses and all orders for this visit:  Rib pain -     DG Ribs Unilateral W/Chest Left; Future   No red flags on exam. Patient is in no acute distress. Will obtain xray for further evaluation and management. Follow-up based on symptoms, worsening precautions advised.  CMA or LPN served as scribe during this visit. History, Physical, and Plan performed by medical provider. The above documentation has been reviewed and is accurate and complete.   Inda Coke, PA-C

## 2020-03-21 NOTE — Patient Instructions (Signed)
It was great to see you!  I will be in touch with your xray results.  Take care,  Inda Coke PA-C

## 2020-04-17 MED FILL — FLUoxetine HCL 20 MG CAPS: 20 | 90 days supply | Qty: 90 | Fill #0

## 2020-04-18 ENCOUNTER — Other Ambulatory Visit: Payer: Self-pay | Admitting: Adult Health

## 2020-04-18 DIAGNOSIS — G4700 Insomnia, unspecified: Secondary | ICD-10-CM

## 2020-04-18 MED ORDER — TEMAZEPAM 15 MG PO CAPS: 15.0000 mg | ORAL_CAPSULE | Freq: Every evening | ORAL | 0 refills | Status: DC | PRN

## 2020-04-18 MED FILL — TEMAZEPAM 15 MG CAPSULE: 15 | 30 days supply | Qty: 30 | Fill #0

## 2020-04-18 NOTE — Progress Notes (Signed)
temazepam

## 2020-04-29 MED FILL — VYVANSE 40 MG CAPSULE: 40 | 90 days supply | Qty: 90 | Fill #0

## 2020-06-03 ENCOUNTER — Other Ambulatory Visit: Payer: Self-pay | Admitting: Adult Health

## 2020-06-03 DIAGNOSIS — F909 Attention-deficit hyperactivity disorder, unspecified type: Secondary | ICD-10-CM

## 2020-06-03 MED ORDER — LISDEXAMFETAMINE DIMESYLATE 40 MG PO CAPS: 40.0000 mg | ORAL_CAPSULE | Freq: Every day | ORAL | 0 refills | Status: DC

## 2020-06-27 ENCOUNTER — Encounter: Payer: Self-pay | Admitting: Physician Assistant

## 2020-06-30 ENCOUNTER — Other Ambulatory Visit (HOSPITAL_COMMUNITY): Payer: Self-pay

## 2020-06-30 MED ORDER — LISINOPRIL-HYDROCHLOROTHIAZIDE 10-12.5 MG PO TABS
1.0000 | ORAL_TABLET | Freq: Every day | ORAL | 0 refills | Status: DC
  Filled 2020-06-30 – 2020-08-02 (×4): qty 30, 30d supply, fill #0

## 2020-07-08 ENCOUNTER — Other Ambulatory Visit (HOSPITAL_COMMUNITY): Payer: Self-pay

## 2020-07-25 ENCOUNTER — Other Ambulatory Visit (HOSPITAL_COMMUNITY): Payer: Self-pay

## 2020-07-25 MED FILL — Lisdexamfetamine Dimesylate Cap 40 MG: ORAL | 90 days supply | Qty: 90 | Fill #0 | Status: CN

## 2020-08-02 ENCOUNTER — Other Ambulatory Visit (HOSPITAL_COMMUNITY): Payer: Self-pay

## 2020-08-02 MED FILL — Lisdexamfetamine Dimesylate Cap 40 MG: ORAL | 90 days supply | Qty: 90 | Fill #0 | Status: AC

## 2020-10-09 ENCOUNTER — Other Ambulatory Visit (HOSPITAL_COMMUNITY): Payer: Self-pay

## 2020-11-04 ENCOUNTER — Other Ambulatory Visit: Payer: Self-pay | Admitting: Adult Health

## 2020-11-04 ENCOUNTER — Other Ambulatory Visit (HOSPITAL_COMMUNITY): Payer: Self-pay

## 2020-11-04 DIAGNOSIS — F909 Attention-deficit hyperactivity disorder, unspecified type: Secondary | ICD-10-CM

## 2020-11-04 MED ORDER — LISDEXAMFETAMINE DIMESYLATE 40 MG PO CAPS
ORAL_CAPSULE | Freq: Every day | ORAL | 0 refills | Status: DC
  Filled 2020-11-04: qty 90, 90d supply, fill #0

## 2020-11-06 ENCOUNTER — Other Ambulatory Visit (HOSPITAL_COMMUNITY): Payer: Self-pay

## 2020-11-18 ENCOUNTER — Other Ambulatory Visit (HOSPITAL_COMMUNITY): Payer: Self-pay

## 2020-11-18 ENCOUNTER — Other Ambulatory Visit: Payer: Self-pay | Admitting: Physician Assistant

## 2020-12-18 ENCOUNTER — Telehealth: Payer: Self-pay

## 2020-12-18 ENCOUNTER — Other Ambulatory Visit (HOSPITAL_COMMUNITY): Payer: Self-pay

## 2020-12-18 DIAGNOSIS — Z23 Encounter for immunization: Secondary | ICD-10-CM | POA: Diagnosis not present

## 2020-12-18 DIAGNOSIS — D224 Melanocytic nevi of scalp and neck: Secondary | ICD-10-CM | POA: Diagnosis not present

## 2020-12-18 NOTE — Telephone Encounter (Signed)
LAST APPOINTMENT DATE:  03/21/20  NEXT APPOINTMENT DATE: 12/19/20  MEDICATION:lisdexamfetamine (VYVANSE) 40 MG capsule  PHARMACY:  [place]

## 2020-12-19 ENCOUNTER — Other Ambulatory Visit (HOSPITAL_COMMUNITY): Payer: Self-pay

## 2020-12-19 ENCOUNTER — Telehealth (INDEPENDENT_AMBULATORY_CARE_PROVIDER_SITE_OTHER): Payer: 59 | Admitting: Physician Assistant

## 2020-12-19 ENCOUNTER — Encounter: Payer: Self-pay | Admitting: Physician Assistant

## 2020-12-19 VITALS — Ht 74.0 in | Wt 200.0 lb

## 2020-12-19 DIAGNOSIS — I10 Essential (primary) hypertension: Secondary | ICD-10-CM

## 2020-12-19 MED ORDER — LISINOPRIL-HYDROCHLOROTHIAZIDE 10-12.5 MG PO TABS
1.0000 | ORAL_TABLET | Freq: Every day | ORAL | 3 refills | Status: DC
  Filled 2020-12-19: qty 90, 90d supply, fill #0
  Filled 2021-04-29: qty 90, 90d supply, fill #1
  Filled 2021-08-24: qty 90, 90d supply, fill #2
  Filled 2021-12-01: qty 19, 19d supply, fill #3
  Filled 2021-12-02: qty 71, 71d supply, fill #3

## 2020-12-19 NOTE — Progress Notes (Signed)
Virtual Visit via Video   I connected with Robert [name] on 12/19/20 at  2:00 PM EDT by a video enabled telemedicine application and verified that I am speaking with the correct person using two identifiers. Location patient: Home Location provider: [facility], Office Persons participating in the virtual visit: [place], [place] PA-C, Anselmo Pickler, LPN   I discussed the limitations of evaluation and management by telemedicine and the availability of in person appointments. The patient expressed understanding and agreed to proceed.  I acted as a Education administrator for [employer], [employer], LPN   Subjective:   HPI:   Hypertension Pt for f/u.  Patient is not currently taking Lisinopril-HCTZ 10-12.5 mg daily.  He has been without his medication for several months.  Pt is checking blood pressure occasionally it is running 150's/95-98. Pt denies headaches, dizziness, blurred vision, chest pain, SOB or lower leg edema. Denies excessive caffeine intake, excessive alcohol intake or increase in salt consumption.    ROS: See pertinent positives and negatives per HPI.  Patient Active Problem List   Diagnosis Date Noted   Hypertension 09/22/2018   Attention deficit hyperactivity disorder (ADHD) 09/22/2018    Social History   Tobacco Use   Smoking status: Never   Smokeless tobacco: Never  Substance Use Topics   Alcohol use: Yes    Current Outpatient Medications:    lisdexamfetamine (VYVANSE) 40 MG capsule, TAKE 1 CAPSULE BY MOUTH ONCE DAILY IN THE MORNING., Disp: 90 capsule, Rfl: 0   lisinopril-hydrochlorothiazide (ZESTORETIC) 10-12.5 MG tablet, TAKE 1 TABLET BY MOUTH ONCE DAILY, Disp: 90 tablet, Rfl: 3   temazepam (RESTORIL) 15 MG capsule, TAKE 1 CAPSULE BY MOUTH AT BEDTIME AS NEEDED FOR SLEEP., Disp: 30 capsule, Rfl: 0  No Known Allergies  Objective:   VITALS: Per patient if applicable, see vitals. GENERAL: Alert, appears well and in no  acute distress. HEENT: Atraumatic, conjunctiva clear, no obvious abnormalities on inspection of external nose and ears. NECK: Normal movements of the head and neck. CARDIOPULMONARY: No increased WOB. Speaking in clear sentences. I:E ratio WNL.  MS: Moves all visible extremities without noticeable abnormality. PSYCH: Pleasant and cooperative, well-groomed. Speech normal rate and rhythm. Affect is appropriate. Insight and judgement are appropriate. Attention is focused, linear, and appropriate.  NEURO: CN grossly intact. Oriented as arrived to appointment on time with no prompting. Moves both UE equally.  SKIN: No obvious lesions, wounds, erythema, or cyanosis noted on face or hands.  Assessment and Plan:   Ngoc was seen today for hypertension.  Diagnoses and all orders for this visit:  Hypertension, unspecified type -     Basic Metabolic Panel (BMET); Future  Other orders -     lisinopril-hydrochlorothiazide (ZESTORETIC) 10-12.5 MG tablet; TAKE 1 TABLET BY MOUTH ONCE DAILY  Uncontrolled Refill medication today and restart today I do not have any recent blood work on him, we will restart medication today but I have advised him that he needs to come next week for BMP, future orders and lab visit scheduled Follow-up with me in 6 months, sooner if concerns or if blood work is abnormal  I discussed the assessment and treatment plan with the patient. The patient was provided an opportunity to ask questions and all were answered. The patient agreed with the plan and demonstrated an understanding of the instructions.   The patient was advised to call back or seek an in-person evaluation if the symptoms worsen or if the condition fails to improve  as anticipated.   CMA or LPN served as scribe during this visit. History, Physical, and Plan performed by medical provider. The above documentation has been reviewed and is accurate and complete.   [place], [place] 12/19/2020

## 2020-12-19 NOTE — Telephone Encounter (Signed)
Pt is scheduled today to see Aldona Bar today. Rx will be sent.

## 2020-12-26 ENCOUNTER — Other Ambulatory Visit: Payer: Self-pay

## 2020-12-26 ENCOUNTER — Other Ambulatory Visit: Payer: Self-pay | Admitting: Physician Assistant

## 2020-12-26 ENCOUNTER — Other Ambulatory Visit (INDEPENDENT_AMBULATORY_CARE_PROVIDER_SITE_OTHER): Payer: 59

## 2020-12-26 DIAGNOSIS — R739 Hyperglycemia, unspecified: Secondary | ICD-10-CM

## 2020-12-26 DIAGNOSIS — I10 Essential (primary) hypertension: Secondary | ICD-10-CM | POA: Diagnosis not present

## 2020-12-26 LAB — BASIC METABOLIC PANEL
BUN: 20 mg/dL (ref 6–23)
CO2: 29 mEq/L (ref 19–32)
Calcium: 9.7 mg/dL (ref 8.4–10.5)
Chloride: 96 mEq/L (ref 96–112)
Creatinine, Ser: 1.18 mg/dL (ref 0.40–1.50)
GFR: 71.74 mL/min (ref >60.00)
Glucose, Bld: 163 mg/dL — ABNORMAL HIGH (ref 70–99)
Potassium: 3.6 mEq/L (ref 3.5–5.1)
Sodium: 135 mEq/L (ref 135–145)

## 2020-12-29 ENCOUNTER — Encounter: Payer: Self-pay | Admitting: Physician Assistant

## 2021-01-14 ENCOUNTER — Telehealth: Payer: Self-pay | Admitting: Physician Assistant

## 2021-01-14 NOTE — Telephone Encounter (Signed)
Please call patient -- this message was sent via MyChart but it remains unread:   Unfortunately the additional test (HgbA1c) cannot be added on to your recent blood draw.   He needs to schedule a lab visit for this.  Thanks!   Aldona Bar

## 2021-01-16 NOTE — Telephone Encounter (Signed)
Left message on voicemail to call office.  

## 2021-01-16 NOTE — Telephone Encounter (Signed)
Left message on home voicemail to call office.

## 2021-01-19 NOTE — Telephone Encounter (Signed)
Pt called back told him unfortunately the additional test Hgb A1c could not be added on to recent blood draw just need to schedule lab visit oly ot have done. Pt verbalized understanding. Appt scheduled for 01/30/2021 at 2:00PM. Pt verbalized understanding.

## 2021-01-19 NOTE — Telephone Encounter (Signed)
Left message on voicemail to call office on home and mobile.  

## 2021-01-30 ENCOUNTER — Other Ambulatory Visit: Payer: 59

## 2021-02-13 ENCOUNTER — Other Ambulatory Visit: Payer: 59

## 2021-02-20 ENCOUNTER — Other Ambulatory Visit: Payer: 59

## 2021-03-11 ENCOUNTER — Other Ambulatory Visit: Payer: Self-pay

## 2021-03-11 ENCOUNTER — Other Ambulatory Visit (HOSPITAL_COMMUNITY): Payer: Self-pay

## 2021-03-11 ENCOUNTER — Ambulatory Visit
Admission: EM | Admit: 2021-03-11 | Discharge: 2021-03-11 | Disposition: A | Discharge status: Home / Self Care | Payer: 59 | Attending: Emergency Medicine | Admitting: Emergency Medicine

## 2021-03-11 DIAGNOSIS — B9789 Other viral agents as the cause of diseases classified elsewhere: Secondary | ICD-10-CM

## 2021-03-11 DIAGNOSIS — J988 Other specified respiratory disorders: Secondary | ICD-10-CM

## 2021-03-11 DIAGNOSIS — R0689 Other abnormalities of breathing: Secondary | ICD-10-CM

## 2021-03-11 DIAGNOSIS — J9801 Acute bronchospasm: Secondary | ICD-10-CM | POA: Diagnosis not present

## 2021-03-11 MED ORDER — GUAIFENESIN 400 MG PO TABS
ORAL_TABLET | ORAL | 0 refills | Status: AC
  Filled 2021-03-11: qty 21, fill #0

## 2021-03-11 MED ORDER — IPRATROPIUM BROMIDE 0.06 % NA SOLN
2.0000 | Freq: Four times a day (QID) | NASAL | 0 refills | Status: AC
  Filled 2021-03-11: qty 15, 19d supply, fill #0

## 2021-03-11 MED ORDER — PROMETHAZINE-DM 6.25-15 MG/5ML PO SYRP
5.0000 mL | ORAL_SOLUTION | Freq: Four times a day (QID) | ORAL | 0 refills | Status: AC | PRN
  Filled 2021-03-11: qty 180, 9d supply, fill #0

## 2021-03-11 MED ORDER — METHYLPREDNISOLONE SODIUM SUCC 125 MG IJ SOLR
125.0000 mg | Freq: Once | INTRAMUSCULAR | Status: AC
  Administered 2021-03-11: 10:00:00 125 mg via INTRAMUSCULAR

## 2021-03-11 MED ORDER — METHYLPREDNISOLONE 4 MG PO TBPK
ORAL_TABLET | ORAL | 0 refills | Status: AC
  Filled 2021-03-11: qty 21, 6d supply, fill #0

## 2021-03-11 MED ORDER — ALBUTEROL SULFATE HFA 108 (90 BASE) MCG/ACT IN AERS
4.0000 | INHALATION_SPRAY | Freq: Once | RESPIRATORY_TRACT | Status: AC
  Administered 2021-03-11: 10:00:00 4 via RESPIRATORY_TRACT

## 2021-03-11 NOTE — ED Provider Notes (Signed)
[facility]    CSN: [id number] Arrival date & time: 03/11/21  0843    HISTORY  No chief complaint on file.  HPI Robert [name] is a 50 y.o. male. Pt c/o sore throat that started about a week ago and he now has congestion with a cough.  Patient states has been consistently taking Mucinex but has not gotten with significant relief.  Patient states the sore throat has mostly resolved, but now has a cough that comes on unexpectedly, is not productive, sometimes worse at night but seems to occur when ever he is exerting himself or talking.  Patient says he had some chills a few nights ago but also was aware that it was 9 degrees outside so is not sure if he has been having fever or not.  Patient states overall has been feeling tired.  Patient states that his daughter and his wife have been sick at home as well but they are both feeling better while he is not.  The history is provided by the patient.  Past Medical History:  Diagnosis Date   ADHD    Patient Active Problem List   Diagnosis Date Noted   Hypertension 09/22/2018   Attention deficit hyperactivity disorder (ADHD) 09/22/2018   History reviewed. No pertinent surgical history.  Home Medications    Prior to Admission medications   Medication Sig Start Date End Date Taking? Authorizing Provider  lisdexamfetamine (VYVANSE) 40 MG capsule TAKE 1 CAPSULE BY MOUTH ONCE DAILY IN THE MORNING. 12/17/19 06/14/20  Mozingo, Berdie Ogren, NP  lisinopril-hydrochlorothiazide (ZESTORETIC) 10-12.5 MG tablet TAKE 1 TABLET BY MOUTH ONCE DAILY 12/19/20 12/19/21  Inda Coke, PA   Family History Family History  Problem Relation Age of Onset   High blood pressure Mother    Stroke Mother    High blood pressure Father    Heart disease Father    Heart disease Maternal Grandfather    High blood pressure Maternal Grandfather    Heart disease Paternal Grandfather    High blood pressure Paternal Grandfather    Social  History Social History   Tobacco Use   Smoking status: Never   Smokeless tobacco: Never  Substance Use Topics   Alcohol use: Yes   Drug use: Never   Allergies   Patient has no known allergies.  Review of Systems Review of Systems Pertinent findings noted in history of present illness.   Physical Exam Triage Vital Signs ED Triage Vitals  Enc Vitals Group     BP 01/09/21 0827 (!) 147/82     Pulse Rate 01/09/21 0827 72     Resp 01/09/21 0827 18     Temp 01/09/21 0827 98.3 F (36.8 C)     Temp Source 01/09/21 0827 Oral     SpO2 01/09/21 0827 98 %     Weight --      Height --      Head Circumference --      Peak Flow --      Pain Score 01/09/21 0826 5     Pain Loc --      Pain Edu? --      Excl. in [place]? --   No data found.  Updated Vital Signs BP 139/83 (BP Location: Left Arm)    Pulse 70    Temp 97.9 F (36.6 C) (Oral)    Resp 18    SpO2 96%   Physical Exam Vitals and nursing note reviewed.  Constitutional:      General:  He is not in acute distress.    Appearance: Normal appearance. He is not ill-appearing.  HENT:     Head: Normocephalic and atraumatic.     Salivary Glands: Right salivary gland is not diffusely enlarged or tender. Left salivary gland is not diffusely enlarged or tender.     Right Ear: Ear canal and external ear normal. No drainage. A middle ear effusion is present. There is no impacted cerumen. Tympanic membrane is bulging. Tympanic membrane is not injected or erythematous.     Left Ear: Ear canal and external ear normal. No drainage. A middle ear effusion is present. There is no impacted cerumen. Tympanic membrane is bulging. Tympanic membrane is not injected or erythematous.     Ears:     Comments: Bilateral EACs normal, both TMs bulging with clear fluid    Nose: Rhinorrhea present. No nasal deformity, septal deviation, signs of injury, nasal tenderness, mucosal edema or congestion. Rhinorrhea is clear.     Right Nostril: Occlusion present. No  foreign body, epistaxis or septal hematoma.     Left Nostril: Occlusion present. No foreign body, epistaxis or septal hematoma.     Right Turbinates: Enlarged, swollen and pale.     Left Turbinates: Enlarged, swollen and pale.     Right Sinus: No maxillary sinus tenderness or frontal sinus tenderness.     Left Sinus: No maxillary sinus tenderness or frontal sinus tenderness.     Mouth/Throat:     Lips: Pink. No lesions.     Mouth: Mucous membranes are moist. No oral lesions.     Pharynx: Oropharynx is clear. Uvula midline. No posterior oropharyngeal erythema or uvula swelling.     Tonsils: No tonsillar exudate. 0 on the right. 0 on the left.     Comments: Postnasal drip Eyes:     General: Lids are normal.        Right eye: No discharge.        Left eye: No discharge.     Extraocular Movements: Extraocular movements intact.     Conjunctiva/sclera: Conjunctivae normal.     Right eye: Right conjunctiva is not injected.     Left eye: Left conjunctiva is not injected.  Neck:     Trachea: Trachea and phonation normal.  Cardiovascular:     Rate and Rhythm: Normal rate and regular rhythm.     Pulses: Normal pulses.     Heart sounds: Normal heart sounds. No murmur heard.   No friction rub. No gallop.  Pulmonary:     Effort: Pulmonary effort is normal. No accessory muscle usage, prolonged expiration or respiratory distress.     Breath sounds: Decreased air movement present. No stridor or transmitted upper airway sounds. Examination of the right-upper field reveals decreased breath sounds. Examination of the left-upper field reveals decreased breath sounds. Examination of the right-middle field reveals decreased breath sounds. Examination of the left-middle field reveals decreased breath sounds. Examination of the right-lower field reveals decreased breath sounds. Examination of the left-lower field reveals decreased breath sounds. Decreased breath sounds present. No wheezing, rhonchi or rales.      Comments: Breath sounds mildly turbulent, patient has a tight cough Chest:     Chest wall: No tenderness.  Musculoskeletal:        General: Normal range of motion.     Cervical back: Normal range of motion and neck supple. Normal range of motion.  Lymphadenopathy:     Cervical: No cervical adenopathy.  Skin:    General: Skin  is warm and dry.     Findings: No erythema or rash.  Neurological:     General: No focal deficit present.     Mental Status: He is alert and oriented to person, place, and time.  Psychiatric:        Mood and Affect: Mood normal.        Behavior: Behavior normal.    Visual Acuity Right Eye Distance:   Left Eye Distance:   Bilateral Distance:    Right Eye Near:   Left Eye Near:    Bilateral Near:     UC Couse / Diagnostics / Procedures:    EKG  Radiology No results found.  Procedures Procedures (including critical care time)  UC Diagnoses / Final Clinical Impressions(s)   I have reviewed the triage vital signs and the nursing notes.  Pertinent labs & imaging results that were available during my care of the patient were reviewed by me and considered in my medical decision making (see chart for details).   Final diagnoses:  Bronchospasm, acute  Viral respiratory illness  Decreased breath sounds   Patient treated with steroids and bronchodilator.  Recommend continue Mucinex, Promethazine DM so he can get some sleep at night.  Conservative care recommended, follow-up precautions advised.  ED Prescriptions     Medication Sig Dispense Auth. Provider   promethazine-dextromethorphan (PROMETHAZINE-DM) 6.25-15 MG/5ML syrup Take 5 mLs by mouth 4 (four) times daily as needed for cough. 180 mL Lynden Oxford Scales, PA-C   guaifenesin (HUMIBID E) 400 MG TABS tablet Take 1 tablet 3 times daily as needed for chest congestion and cough 21 tablet Lynden Oxford Scales, PA-C   ipratropium (ATROVENT) 0.06 % nasal spray Place 2 sprays into both nostrils 4  (four) times daily. As needed for nasal congestion, runny nose 15 mL Lynden Oxford Scales, PA-C   methylPREDNISolone (MEDROL DOSEPAK) 4 MG TBPK tablet Take 24 mg on day 1, 20 mg on day 2, 16 mg on day 3, 12 mg on day 4, 8 mg on day 5, 4 mg on day 6. 21 tablet Lynden Oxford Scales, PA-C      PDMP not reviewed this encounter.  Pending results:  Labs Reviewed - No data to display  Medications Ordered in UC: Medications  methylPREDNISolone sodium succinate (SOLU-MEDROL) 125 mg/2 mL injection 125 mg (has no administration in time range)  albuterol (VENTOLIN HFA) 108 (90 Base) MCG/ACT inhaler 4 puff (has no administration in time range)    Disposition Upon Discharge:  Condition: stable for discharge home Home: take medications as prescribed; routine discharge instructions as discussed; follow up as advised.  Patient presented with an acute illness with associated systemic symptoms and significant discomfort requiring urgent management. In my opinion, this is a condition that a prudent lay person (someone who possesses an average knowledge of health and medicine) may potentially expect to result in complications if not addressed urgently such as respiratory distress, impairment of bodily function or dysfunction of bodily organs.   Routine symptom specific, illness specific and/or disease specific instructions were discussed with the patient and/or caregiver at length.   As such, the patient has been evaluated and assessed, work-up was performed and treatment was provided in alignment with urgent care protocols and evidence based medicine.  Patient/parent/caregiver has been advised that the patient may require follow up for further testing and treatment if the symptoms continue in spite of treatment, as clinically indicated and appropriate.  If the patient was tested for COVID-19, Influenza and/or RSV,  then the patient/parent/guardian was advised to isolate at home pending the results of  his/her diagnostic coronavirus test and potentially longer if theyre positive. I have also advised pt that if his/her COVID-19 test returns positive, it's recommended to self-isolate for at least 10 days after symptoms first appeared AND until fever-free for 24 hours without fever reducer AND other symptoms have improved or resolved. Discussed self-isolation recommendations as well as instructions for household member/close contacts as per the [facility] and [place] DHHS, and also gave patient the [facility] packet with this information.  Patient/parent/caregiver has been advised to return to the [facility] or PCP in 3-5 days if no better; to PCP or the Emergency Department if new signs and symptoms develop, or if the current signs or symptoms continue to change or worsen for further workup, evaluation and treatment as clinically indicated and appropriate  The patient will follow up with their current PCP if and as advised. If the patient does not currently have a PCP we will assist them in obtaining one.   The patient may need specialty follow up if the symptoms continue, in spite of conservative treatment and management, for further workup, evaluation, consultation and treatment as clinically indicated and appropriate.  Patient/parent/caregiver verbalized understanding and agreement of plan as discussed.  All questions were addressed during visit.  Please see discharge instructions below for further details of plan.  Discharge Instructions:   Discharge Instructions      Based on the history you provided and my physical exam findings, believe that you are suffering from the aftermath of a viral respiratory illness that is caused you to have bronchospasm.  Conservative care is recommended at this time.  This includes rest, pushing clear fluids and activity as tolerated.  Warm beverages his teas and broths versus cold beverages/popsicles and frozen sherbet/sorbet are personal choice, both warm and cold are beneficial.   You may also notice that your appetite is reduced; this is okay as long as you are drinking plenty of clear fluids.    Please see the list below for recommended medications, dosages and frequencies to provide relief of your current symptoms:      Guaifenesin (Robitussin, Mucinex): This is an expectorant.  This helps break up chest congestion and loosen up thick nasal drainage making phlegm and drainage more liquid and therefore easier to remove.  I recommend being 400 mg three times daily as needed.      Promethazine DM: Promethazine is both the nasal decongestant and an antinausea medication that makes most patients feel fairly sleepy.  The DM is dextromethorphan, a cough suppressant found many over-the-counter cough medications.  Please take 5 mL before bedtime to help you sleep better, minimize your cough.  I have provided you with a prescription for this medication.      Ipratropium (Atrovent): This is an excellent nasal decongestant spray that does not cause rebound congestion, can be used up to 4 times daily as needed, instill 2 sprays into each nare with each use.  I have provided you with a prescription for this medication.      Methylprednisolone IM (Solu-Medrol):  To help resolve your to quickly address your significant respiratory inflammation, you are provided with an injection of methylprednisolone in the office today.  You should continue to feel the full benefit of the steroid for the next 4 to 6 hours.    Methylprednisolone (Medrol Dosepak): This is a steroid that will significantly calm your upper and lower airways, please take one row of  tablets daily with your breakfast meal starting tomorrow morning until the prescription is complete.     Please follow-up within the next 3 to 5 days either with your primary care provider or urgent care if your symptoms do not resolve.  If you do not have a primary care provider, we will assist you in finding one.       This office note has  been dictated using Museum/gallery curator.  Unfortunately, and despite my best efforts, this method of dictation can sometimes lead to occasional typographical or grammatical errors.  I apologize in advance if this occurs.     Lynden Oxford [place], [place] 03/11/21 [phone]

## 2021-03-11 NOTE — Discharge Instructions (Addendum)
Based on the history you provided and my physical exam findings, believe that you are suffering from the aftermath of a viral respiratory illness that is caused you to have bronchospasm.  Conservative care is recommended at this time.  This includes rest, pushing clear fluids and activity as tolerated.  Warm beverages his teas and broths versus cold beverages/popsicles and frozen sherbet/sorbet are personal choice, both warm and cold are beneficial.  You may also notice that your appetite is reduced; this is okay as long as you are drinking plenty of clear fluids.    Please see the list below for recommended medications, dosages and frequencies to provide relief of your current symptoms:      Guaifenesin (Robitussin, Mucinex): This is an expectorant.  This helps break up chest congestion and loosen up thick nasal drainage making phlegm and drainage more liquid and therefore easier to remove.  I recommend being 400 mg three times daily as needed.      Promethazine DM: Promethazine is both the nasal decongestant and an antinausea medication that makes most patients feel fairly sleepy.  The DM is dextromethorphan, a cough suppressant found many over-the-counter cough medications.  Please take 5 mL before bedtime to help you sleep better, minimize your cough.  I have provided you with a prescription for this medication.      Ipratropium (Atrovent): This is an excellent nasal decongestant spray that does not cause rebound congestion, can be used up to 4 times daily as needed, instill 2 sprays into each nare with each use.  I have provided you with a prescription for this medication.      Methylprednisolone IM (Solu-Medrol):  To help resolve your to quickly address your significant respiratory inflammation, you are provided with an injection of methylprednisolone in the office today.  You should continue to feel the full benefit of the steroid for the next 4 to 6 hours.    Methylprednisolone (Medrol  Dosepak): This is a steroid that will significantly calm your upper and lower airways, please take one row of tablets daily with your breakfast meal starting tomorrow morning until the prescription is complete.     Please follow-up within the next 3 to 5 days either with your primary care provider or urgent care if your symptoms do not resolve.  If you do not have a primary care provider, we will assist you in finding one.

## 2021-03-11 NOTE — ED Triage Notes (Signed)
Pt c/o sore throat that started about a week ago and he now has congestion with a cough.

## 2021-03-20 ENCOUNTER — Other Ambulatory Visit: Payer: Self-pay | Admitting: Adult Health

## 2021-03-20 ENCOUNTER — Other Ambulatory Visit (HOSPITAL_COMMUNITY): Payer: Self-pay

## 2021-03-20 DIAGNOSIS — F909 Attention-deficit hyperactivity disorder, unspecified type: Secondary | ICD-10-CM

## 2021-03-20 MED ORDER — AMPHETAMINE-DEXTROAMPHETAMINE 20 MG PO TABS
20.0000 mg | ORAL_TABLET | Freq: Every day | ORAL | 0 refills | Status: DC
Start: 2021-03-20 — End: 2021-06-26
  Filled 2021-03-20: qty 90, 90d supply, fill #0

## 2021-04-29 ENCOUNTER — Telehealth: Payer: Self-pay | Admitting: Physician Assistant

## 2021-04-29 ENCOUNTER — Other Ambulatory Visit (HOSPITAL_COMMUNITY): Payer: Self-pay

## 2021-04-29 NOTE — Telephone Encounter (Signed)
Patient called asking for a refill for lisinopril-hydrochlorothiazide (ZESTORETIC) 10-12.5 MG tablet  Please send to [facility]. Patient has lab appt for A1C on 05/08/21 at 2 pm.

## 2021-04-29 NOTE — Telephone Encounter (Signed)
Left detailed message on personal voicemail. Rx was sent in Oct to [facility], 90 tablets with 3 refills, so you should have refills left. Any problems please call the office.

## 2021-05-08 ENCOUNTER — Other Ambulatory Visit: Payer: 59

## 2021-06-26 ENCOUNTER — Other Ambulatory Visit: Payer: Self-pay | Admitting: Adult Health

## 2021-06-26 DIAGNOSIS — F909 Attention-deficit hyperactivity disorder, unspecified type: Secondary | ICD-10-CM

## 2021-06-26 MED ORDER — AMPHETAMINE-DEXTROAMPHETAMINE 20 MG PO TABS: 20.0000 mg | ORAL_TABLET | Freq: Every day | ORAL | 0 refills | Status: DC

## 2021-08-03 ENCOUNTER — Other Ambulatory Visit: Payer: Self-pay | Admitting: Adult Health

## 2021-08-03 ENCOUNTER — Other Ambulatory Visit (HOSPITAL_COMMUNITY): Payer: Self-pay

## 2021-08-03 DIAGNOSIS — F909 Attention-deficit hyperactivity disorder, unspecified type: Secondary | ICD-10-CM

## 2021-08-03 MED ORDER — AMPHETAMINE-DEXTROAMPHETAMINE 20 MG PO TABS
20.0000 mg | ORAL_TABLET | Freq: Every day | ORAL | 0 refills | Status: DC
  Filled 2021-08-03 – 2021-08-20 (×2): qty 30, 30d supply, fill #0

## 2021-08-04 ENCOUNTER — Other Ambulatory Visit (HOSPITAL_COMMUNITY): Payer: Self-pay

## 2021-08-12 ENCOUNTER — Other Ambulatory Visit (HOSPITAL_COMMUNITY): Payer: Self-pay

## 2021-08-14 ENCOUNTER — Other Ambulatory Visit (HOSPITAL_COMMUNITY): Payer: Self-pay

## 2021-08-20 ENCOUNTER — Other Ambulatory Visit (HOSPITAL_COMMUNITY): Payer: Self-pay

## 2021-08-24 ENCOUNTER — Other Ambulatory Visit (HOSPITAL_COMMUNITY): Payer: Self-pay

## 2021-09-22 ENCOUNTER — Other Ambulatory Visit (HOSPITAL_COMMUNITY): Payer: Self-pay

## 2021-09-22 ENCOUNTER — Other Ambulatory Visit: Payer: Self-pay | Admitting: Adult Health

## 2021-09-22 DIAGNOSIS — F909 Attention-deficit hyperactivity disorder, unspecified type: Secondary | ICD-10-CM

## 2021-09-22 MED ORDER — AMPHETAMINE-DEXTROAMPHETAMINE 20 MG PO TABS
20.0000 mg | ORAL_TABLET | Freq: Every day | ORAL | 0 refills | Status: DC
  Filled 2021-09-22: qty 90, 90d supply, fill #0

## 2021-09-26 ENCOUNTER — Other Ambulatory Visit (HOSPITAL_COMMUNITY): Payer: Self-pay

## 2021-12-01 ENCOUNTER — Other Ambulatory Visit (HOSPITAL_COMMUNITY): Payer: Self-pay

## 2021-12-02 ENCOUNTER — Other Ambulatory Visit (HOSPITAL_COMMUNITY): Payer: Self-pay

## 2021-12-05 ENCOUNTER — Other Ambulatory Visit (HOSPITAL_COMMUNITY): Payer: Self-pay

## 2021-12-07 ENCOUNTER — Encounter: Payer: Self-pay | Admitting: *Deleted

## 2021-12-22 ENCOUNTER — Other Ambulatory Visit (HOSPITAL_COMMUNITY): Payer: Self-pay

## 2021-12-22 ENCOUNTER — Other Ambulatory Visit: Payer: Self-pay | Admitting: Adult Health

## 2021-12-22 DIAGNOSIS — F909 Attention-deficit hyperactivity disorder, unspecified type: Secondary | ICD-10-CM

## 2021-12-22 MED ORDER — AMPHETAMINE-DEXTROAMPHETAMINE 20 MG PO TABS
20.0000 mg | ORAL_TABLET | Freq: Every day | ORAL | 0 refills | Status: DC
  Filled 2021-12-22: qty 90, 90d supply, fill #0

## 2021-12-23 IMAGING — DX DG RIBS W/ CHEST 3+V*L*
3 series · 3 of 3 positions shown · non-contrast
Comparison: None.

CLINICAL DATA: 49-year-old male with fall and left posterior rib
pain.

EXAM:
LEFT RIBS AND CHEST - 3+ VIEW

[chest pa]
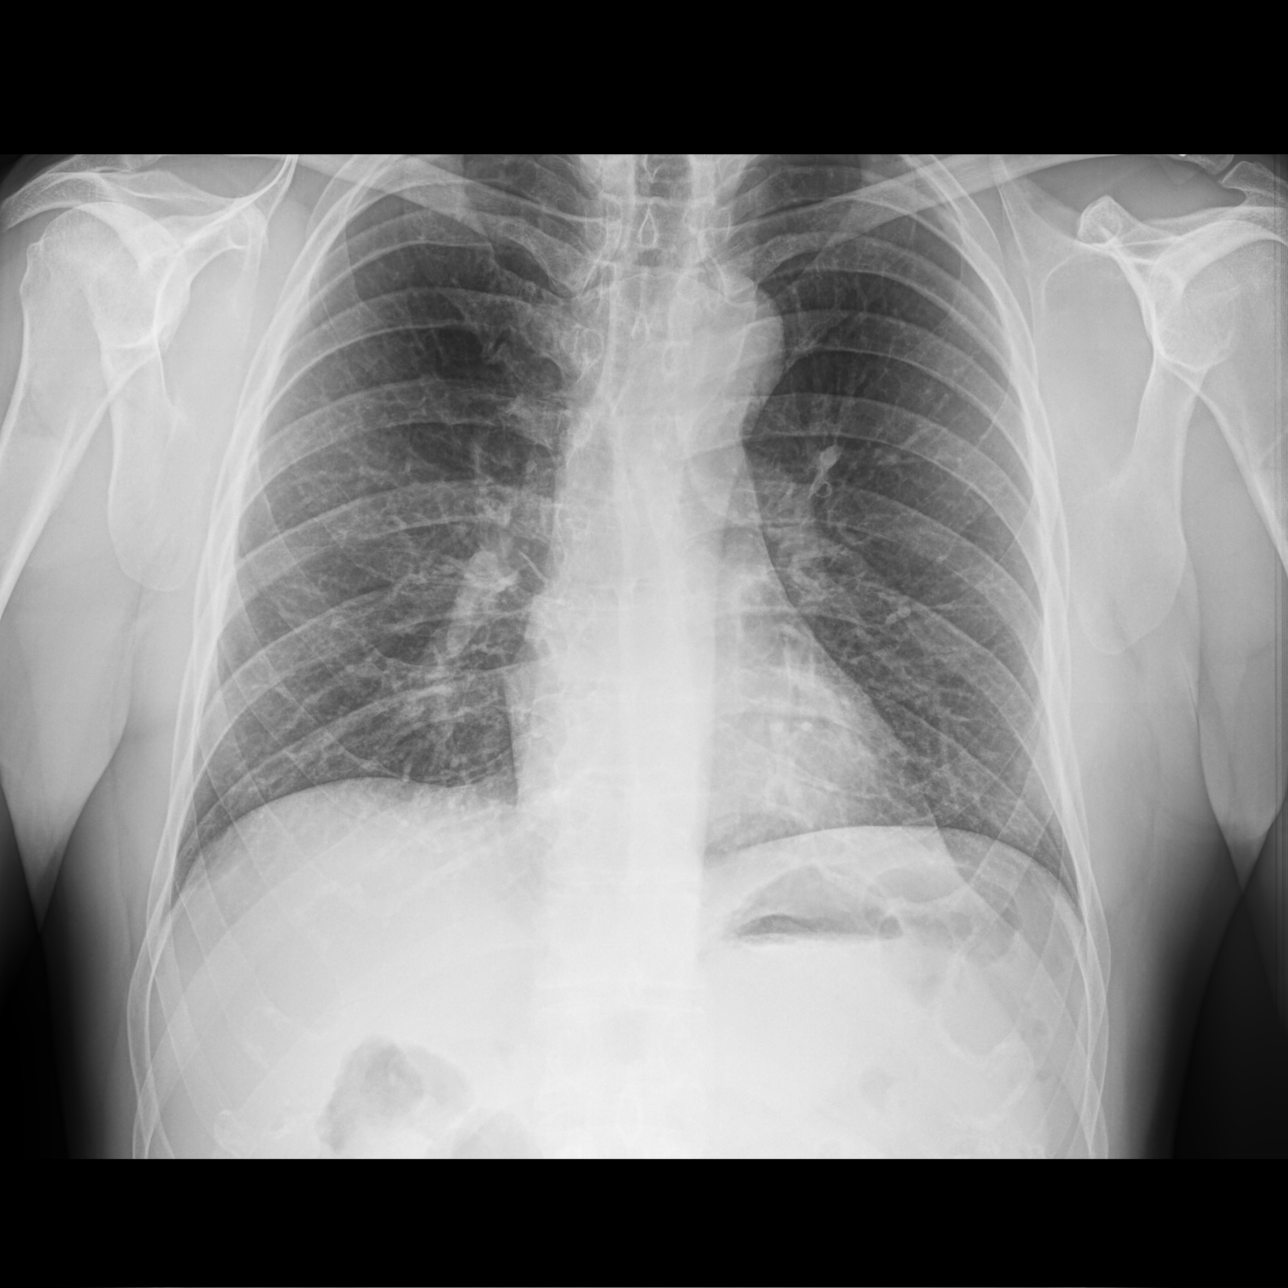

[hemithorax (ribs) ap]
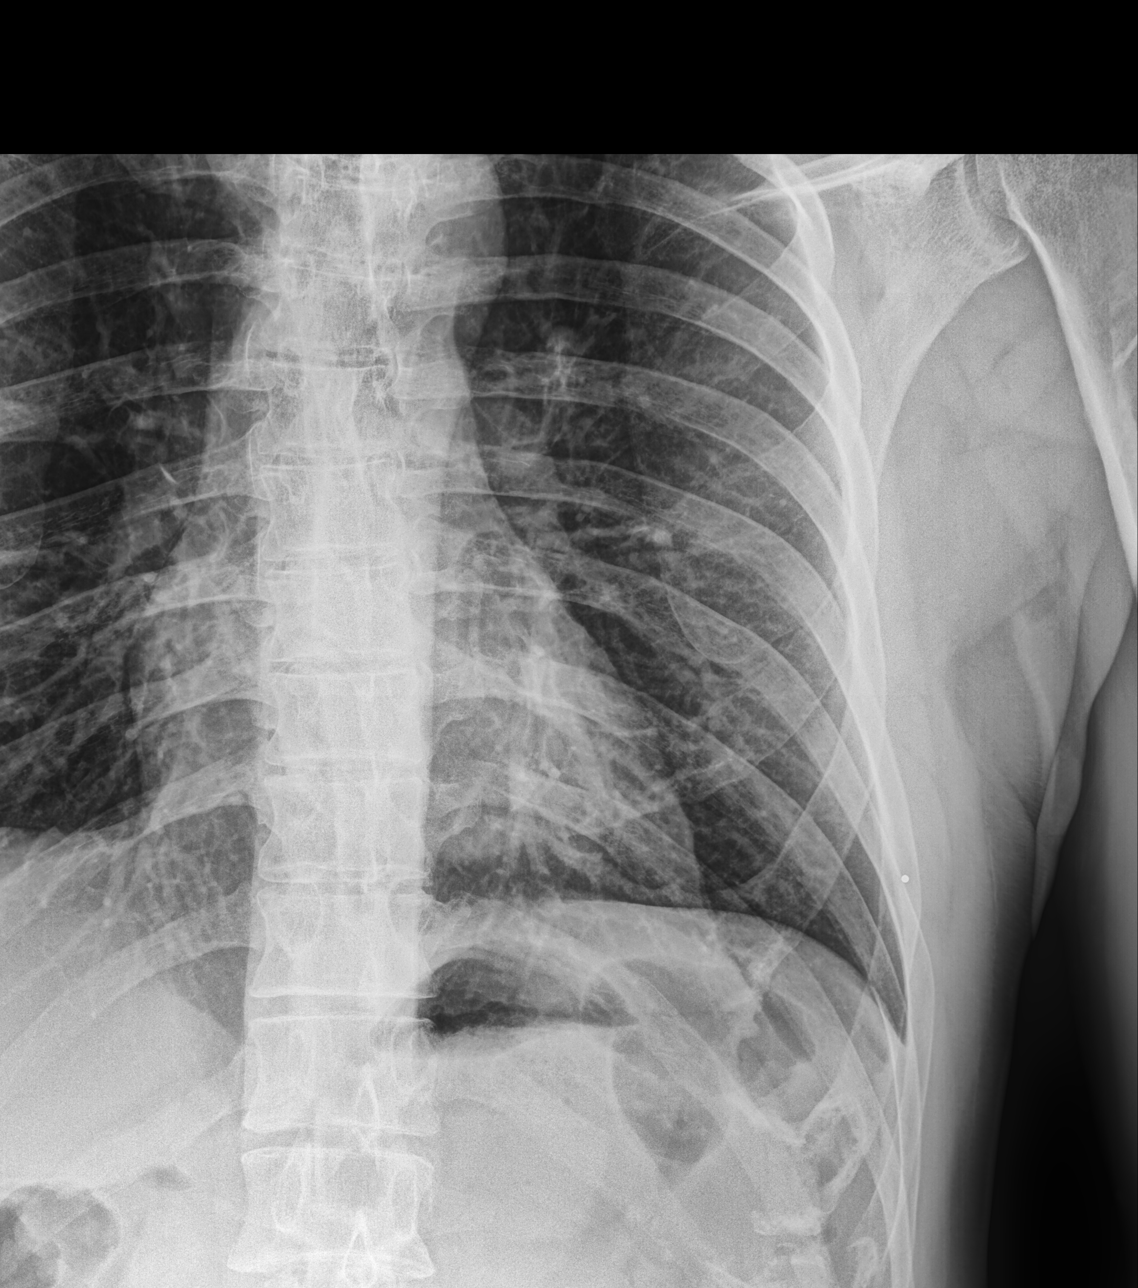

[oblique ribs oblique]
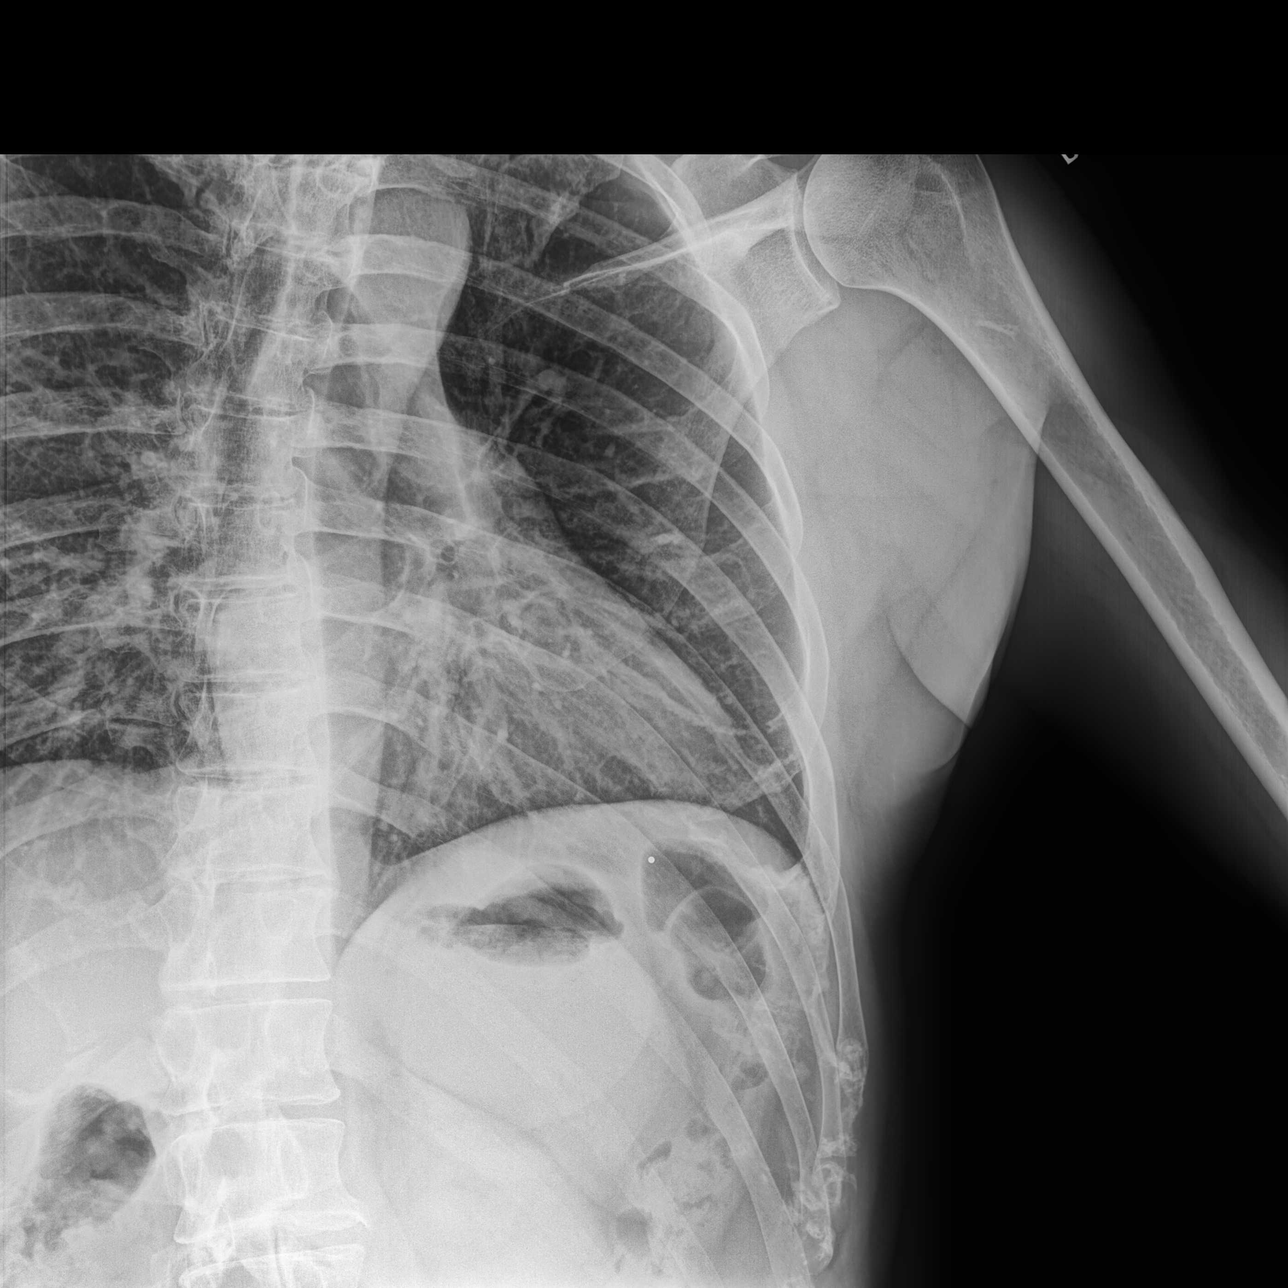

[3 of 3 positions shown; findings below may reference images not displayed]

FINDINGS: The lungs are clear. There is no pleural effusion or pneumothorax.
The cardiac silhouette is within limits. No acute osseous pathology.
No displaced rib fractures.
IMPRESSION: Negative.

## 2022-02-25 ENCOUNTER — Encounter: Payer: Self-pay | Admitting: *Deleted

## 2022-03-13 ENCOUNTER — Other Ambulatory Visit (HOSPITAL_COMMUNITY): Payer: Self-pay

## 2022-03-19 ENCOUNTER — Other Ambulatory Visit (HOSPITAL_COMMUNITY): Payer: Self-pay

## 2022-04-03 ENCOUNTER — Other Ambulatory Visit (HOSPITAL_COMMUNITY): Payer: Self-pay

## 2022-04-08 ENCOUNTER — Other Ambulatory Visit: Payer: Self-pay | Admitting: Adult Health

## 2022-04-08 ENCOUNTER — Other Ambulatory Visit (HOSPITAL_COMMUNITY): Payer: Self-pay

## 2022-04-08 DIAGNOSIS — F909 Attention-deficit hyperactivity disorder, unspecified type: Secondary | ICD-10-CM

## 2022-04-08 MED ORDER — AMPHETAMINE-DEXTROAMPHETAMINE 20 MG PO TABS
20.0000 mg | ORAL_TABLET | Freq: Every day | ORAL | 0 refills | Status: DC
  Filled 2022-04-08: qty 90, 90d supply, fill #0

## 2022-07-27 ENCOUNTER — Encounter: Payer: Self-pay | Admitting: Physician Assistant

## 2022-07-27 ENCOUNTER — Other Ambulatory Visit: Payer: Self-pay | Admitting: Physician Assistant

## 2022-07-27 ENCOUNTER — Other Ambulatory Visit (HOSPITAL_COMMUNITY): Payer: Self-pay

## 2022-08-13 ENCOUNTER — Other Ambulatory Visit (HOSPITAL_COMMUNITY): Payer: Self-pay

## 2022-08-31 ENCOUNTER — Other Ambulatory Visit (HOSPITAL_COMMUNITY): Payer: Self-pay

## 2022-08-31 MED ORDER — LISINOPRIL 10 MG PO TABS
10.0000 mg | ORAL_TABLET | Freq: Every day | ORAL | 0 refills | Status: DC
  Filled 2022-08-31: qty 30, 30d supply, fill #0

## 2022-09-21 ENCOUNTER — Other Ambulatory Visit (HOSPITAL_COMMUNITY): Payer: Self-pay

## 2022-09-21 ENCOUNTER — Other Ambulatory Visit: Payer: Self-pay | Admitting: Adult Health

## 2022-09-21 DIAGNOSIS — F909 Attention-deficit hyperactivity disorder, unspecified type: Secondary | ICD-10-CM

## 2022-09-21 MED ORDER — AMPHETAMINE-DEXTROAMPHETAMINE 20 MG PO TABS
20.0000 mg | ORAL_TABLET | Freq: Every day | ORAL | 0 refills | Status: DC
  Filled 2022-09-21: qty 90, 90d supply, fill #0

## 2022-10-14 ENCOUNTER — Other Ambulatory Visit: Payer: Self-pay | Admitting: Psychiatry

## 2022-10-14 ENCOUNTER — Other Ambulatory Visit: Payer: Self-pay

## 2022-10-14 ENCOUNTER — Other Ambulatory Visit (HOSPITAL_COMMUNITY): Payer: Self-pay

## 2022-10-14 MED ORDER — LISINOPRIL 10 MG PO TABS
10.0000 mg | ORAL_TABLET | Freq: Every day | ORAL | 1 refills | Status: DC
  Filled 2022-10-14 (×2): qty 30, 30d supply, fill #0
  Filled 2022-11-24: qty 30, 30d supply, fill #1

## 2022-11-24 ENCOUNTER — Other Ambulatory Visit (HOSPITAL_COMMUNITY): Payer: Self-pay

## 2022-11-25 ENCOUNTER — Other Ambulatory Visit (HOSPITAL_COMMUNITY): Payer: Self-pay

## 2022-12-23 ENCOUNTER — Other Ambulatory Visit: Payer: Self-pay | Admitting: Adult Health

## 2022-12-23 ENCOUNTER — Other Ambulatory Visit (HOSPITAL_COMMUNITY): Payer: Self-pay

## 2022-12-23 DIAGNOSIS — F909 Attention-deficit hyperactivity disorder, unspecified type: Secondary | ICD-10-CM

## 2022-12-23 MED ORDER — AMPHETAMINE-DEXTROAMPHETAMINE 20 MG PO TABS
20.0000 mg | ORAL_TABLET | Freq: Every day | ORAL | 0 refills | Status: DC
  Filled 2022-12-23: qty 90, 90d supply, fill #0

## 2023-01-14 ENCOUNTER — Other Ambulatory Visit: Payer: Self-pay

## 2023-01-14 ENCOUNTER — Other Ambulatory Visit (HOSPITAL_COMMUNITY): Payer: Self-pay

## 2023-01-14 MED ORDER — LISINOPRIL 10 MG PO TABS
10.0000 mg | ORAL_TABLET | Freq: Every day | ORAL | 2 refills | Status: DC
  Filled 2023-01-14: qty 30, 30d supply, fill #0

## 2023-03-25 ENCOUNTER — Other Ambulatory Visit (HOSPITAL_COMMUNITY): Payer: Self-pay

## 2023-03-25 ENCOUNTER — Other Ambulatory Visit: Payer: Self-pay

## 2023-03-25 MED ORDER — LISINOPRIL 10 MG PO TABS
10.0000 mg | ORAL_TABLET | Freq: Every day | ORAL | 1 refills | Status: DC
  Filled 2023-03-25: qty 90, 90d supply, fill #0

## 2023-04-28 ENCOUNTER — Other Ambulatory Visit: Payer: Self-pay | Admitting: Adult Health

## 2023-04-28 ENCOUNTER — Other Ambulatory Visit (HOSPITAL_COMMUNITY): Payer: Self-pay

## 2023-04-28 DIAGNOSIS — F909 Attention-deficit hyperactivity disorder, unspecified type: Secondary | ICD-10-CM

## 2023-04-28 MED ORDER — AMPHETAMINE-DEXTROAMPHETAMINE 20 MG PO TABS
20.0000 mg | ORAL_TABLET | Freq: Every day | ORAL | 0 refills | Status: DC
  Filled 2023-04-28: qty 90, 90d supply, fill #0

## 2023-05-06 ENCOUNTER — Other Ambulatory Visit (HOSPITAL_COMMUNITY): Payer: Self-pay

## 2023-06-07 ENCOUNTER — Other Ambulatory Visit (HOSPITAL_COMMUNITY): Payer: Self-pay

## 2023-06-07 ENCOUNTER — Telehealth: Admitting: Physician Assistant

## 2023-06-07 DIAGNOSIS — Z299 Encounter for prophylactic measures, unspecified: Secondary | ICD-10-CM

## 2023-06-07 DIAGNOSIS — S30861A Insect bite (nonvenomous) of abdominal wall, initial encounter: Secondary | ICD-10-CM | POA: Diagnosis not present

## 2023-06-07 DIAGNOSIS — W57XXXA Bitten or stung by nonvenomous insect and other nonvenomous arthropods, initial encounter: Secondary | ICD-10-CM | POA: Diagnosis not present

## 2023-06-07 MED ORDER — DOXYCYCLINE HYCLATE 100 MG PO TABS
200.0000 mg | ORAL_TABLET | Freq: Once | ORAL | 0 refills | Status: AC
Start: 2023-06-07 — End: 2023-06-08
  Filled 2023-06-07: qty 2, 1d supply, fill #0

## 2023-06-07 NOTE — Progress Notes (Signed)
 Virtual Visit Consent   Robert [name], you are scheduled for a virtual visit with a [place] provider today. Just as with appointments in the office, your consent must be obtained to participate. Your consent will be active for this visit and any virtual visit you may have with one of our providers in the next 365 days. If you have a MyChart account, a copy of this consent can be sent to you electronically.  As this is a virtual visit, video technology does not allow for your provider to perform a traditional examination. This may limit your provider's ability to fully assess your condition. If your provider identifies any concerns that need to be evaluated in person or the need to arrange testing (such as labs, EKG, etc.), we will make arrangements to do so. Although advances in technology are sophisticated, we cannot ensure that it will always work on either your end or our end. If the connection with a video visit is poor, the visit may have to be switched to a telephone visit. With either a video or telephone visit, we are not always able to ensure that we have a secure connection.  By engaging in this virtual visit, you consent to the provision of healthcare and authorize for your insurance to be billed (if applicable) for the services provided during this visit. Depending on your insurance coverage, you may receive a charge related to this service.  I need to obtain your verbal consent now. Are you willing to proceed with your visit today? BURNARD ENIS has provided verbal consent on 06/07/2023 for a virtual visit (video or telephone). Robert [name], [place]  Date: 06/07/2023 2:09 PM   Virtual Visit via Video Note   I, Robert [name], connected with  MEREL SANTOLI  ([id number], 11-29-1970) on 06/07/23 at  2:00 PM EDT by a video-enabled telemedicine application and verified that I am speaking with the correct person using two identifiers.  Location: Patient:  Virtual Visit Location Patient: Home Provider: Virtual Visit Location Provider: Home Office   I discussed the limitations of evaluation and management by telemedicine and the availability of in person appointments. The patient expressed understanding and agreed to proceed.    History of Present Illness: Robert [name] is a 53 y.o. who identifies as a male who was assigned male at birth, and is being seen today for tick bite to his abdomen first noted 3/23 and successfully removed 3/24. Notes mild itching and some residual swelling -- about the width of a dime.  Denies fever, chills, malaise or fatigue.   HPI: HPI  Problems:  Patient Active Problem List   Diagnosis Date Noted   Hypertension 09/22/2018   Attention deficit hyperactivity disorder (ADHD) 09/22/2018    Allergies: No Known Allergies Medications:  Current Outpatient Medications:    amphetamine-dextroamphetamine (ADDERALL) 20 MG tablet, Take 1 tablet (20 mg) by mouth daily., Disp: 90 tablet, Rfl: 0   guaifenesin (HUMIBID E) 400 MG TABS tablet, Take 1 tablet 3 times daily as needed for chest congestion and cough, Disp: 21 tablet, Rfl: 0   ipratropium (ATROVENT) 0.06 % nasal spray, Place 2 sprays into both nostrils 4 times daily as needed for nasal congestion, runny nose, Disp: 15 mL, Rfl: 0   lisdexamfetamine (VYVANSE) 40 MG capsule, TAKE 1 CAPSULE BY MOUTH ONCE DAILY IN THE MORNING., Disp: 90 capsule, Rfl: 0   lisinopril (ZESTRIL) 10 MG tablet, Take 1 tablet (10 mg total) by mouth daily., Disp: 90 tablet,  Rfl: 1   lisinopril-hydrochlorothiazide (ZESTORETIC) 10-12.5 MG tablet, Take 1 tablet by mouth daily., Disp: 90 tablet, Rfl: 3   methylPREDNISolone (MEDROL DOSEPAK) 4 MG TBPK tablet, Take 6 tablets by mouth on day 1, then decrease by 1 tablet daily until finished (6-5-4-3-2-1), Disp: 21 tablet, Rfl: 0   promethazine-dextromethorphan (PROMETHAZINE-DM) 6.25-15 MG/5ML syrup, Take 5 mLs by mouth 4 times daily as needed for  cough., Disp: 180 mL, Rfl: 0  Observations/Objective: Patient is well-developed, well-nourished in no acute distress.  Resting comfortably at home.  Head is normocephalic, atraumatic.  No labored breathing. Speech is clear and coherent with logical content.  Patient is alert and oriented at baseline.  Area of tick bite to the left of navel. Tick completely removed. Mil residual erythema just around the site of the bite. No notes rashes, focal swelling, drainage.  Assessment and Plan: 1. Tick bite of abdomen, initial encounter (Primary)  No sign of active infection. Lower risk of tick borne illness giving short duration of attachment, but will add on preventive dose of Doxycycline as a precautions. Signs and symptoms of tick-borne illness discussed, including when to follow-up with [place] and/or PCP.  Follow Up Instructions: I discussed the assessment and treatment plan with the patient. The patient was provided an opportunity to ask questions and all were answered. The patient agreed with the plan and demonstrated an understanding of the instructions.  A copy of instructions were sent to the patient via MyChart unless otherwise noted below.   The patient was advised to call back or seek an in-person evaluation if the symptoms worsen or if the condition fails to improve as anticipated.    Robert Climes, PA-C

## 2023-06-07 NOTE — Patient Instructions (Signed)
 Robert [name], thank you for joining Robert Climes, PA-C for today's virtual visit.  While this provider is not your primary care provider (PCP), if your PCP is located in our provider database this encounter information will be shared with them immediately following your visit.   A [facility] MyChart account gives you access to today's visit and all your visits, tests, and labs performed at [facility] " click here if you don't have a [name] MyChart account or go to [email or web address]  Consent: (Patient) Robert [name] provided verbal consent for this virtual visit at the beginning of the encounter.  Current Medications:  Current Outpatient Medications:    amphetamine-dextroamphetamine (ADDERALL) 20 MG tablet, Take 1 tablet (20 mg) by mouth daily., Disp: 90 tablet, Rfl: 0   guaifenesin (HUMIBID E) 400 MG TABS tablet, Take 1 tablet 3 times daily as needed for chest congestion and cough, Disp: 21 tablet, Rfl: 0   ipratropium (ATROVENT) 0.06 % nasal spray, Place 2 sprays into both nostrils 4 times daily as needed for nasal congestion, runny nose, Disp: 15 mL, Rfl: 0   lisdexamfetamine (VYVANSE) 40 MG capsule, TAKE 1 CAPSULE BY MOUTH ONCE DAILY IN THE MORNING., Disp: 90 capsule, Rfl: 0   lisinopril (ZESTRIL) 10 MG tablet, Take 1 tablet (10 mg total) by mouth daily., Disp: 90 tablet, Rfl: 1   lisinopril-hydrochlorothiazide (ZESTORETIC) 10-12.5 MG tablet, Take 1 tablet by mouth daily., Disp: 90 tablet, Rfl: 3   methylPREDNISolone (MEDROL DOSEPAK) 4 MG TBPK tablet, Take 6 tablets by mouth on day 1, then decrease by 1 tablet daily until finished (6-5-4-3-2-1), Disp: 21 tablet, Rfl: 0   promethazine-dextromethorphan (PROMETHAZINE-DM) 6.25-15 MG/5ML syrup, Take 5 mLs by mouth 4 times daily as needed for cough., Disp: 180 mL, Rfl: 0   Medications ordered in this encounter:  No orders of the defined types were placed in this encounter.    *If you need  refills on other medications prior to your next appointment, please contact your pharmacy*  Follow-Up: Call back or seek an in-person evaluation if the symptoms worsen or if the condition fails to improve as anticipated.   Virtual Care [phone]  Other Instructions Tick Bite Information, Adult  Ticks are insects that can bite. Most ticks live in bushes and grassy areas. They climb onto people and animals that go by. Then they bite. Some ticks carry germs that can make you sick. How can I prevent tick bites? Take these steps: Before you go outside: Wear long sleeves and long pants. Wear light-colored clothes. Tuck your pant legs into your socks. Use an insect repellent that has 20% or higher of the ingredients DEET, picaridin, or IR3535. Follow the instructions on the label. Put it on: Bare skin. Avoid your eyes and mouth areas. The tops of your boots. Your pant legs. The ends of your sleeves. If you use an insect repellent that has the ingredient permethrin, follow the instructions on the label. Do not put permethrin on the skin. Put it on: Clothing. Shoes. Outdoor gear. Tents. When you are outside Avoid walking through long grass. Stay in the middle of the trail. Do not touch the bushes. Check for ticks on your clothes, hair, and skin often while you are outside. Check again before you go inside. When you go indoors Check your clothes for ticks. Dry your clothes in a dryer on high heat for 10 minutes or more. If clothes are damp, more time may be needed. Wash your clothes  right away if they need to be washed. Use hot water. Check your pets and outdoor gear. Shower right away. Check your body for ticks. Do a full body check using a mirror. Check your clothes, skin, head, neck, armpits, waist, groin, and joint areas. What is the right way to remove a tick? Remove the tick from your skin as soon as possible. Do not remove the tick with your bare fingers. Do not try  to remove a tick with heat, alcohol, petroleum jelly, or fingernail polish. To remove a tick that is crawling on your skin: Go outside and brush the tick off. Use tape or a lint roller. To remove a tick that is biting: Wash your hands. If you have gloves, put them on. Use tweezers, curved forceps, or a tick-removal tool to grasp the tick. Grasp the tick as close to your skin and as close to the tick's head as possible. Gently pull up until the tick lets go. Try to keep the tick's head attached to its body. Do not twist or jerk the tick. Do not squeeze or crush the tick. What should I do after taking out a tick? Clean the bite area and your hands with soap and water, rubbing alcohol, or an iodine wash. If you have an antiseptic cream or ointment, put a small amount on the bite area. Wash and disinfect any instruments that you used to remove the tick. How should I get rid of a live tick? To get rid of a live tick, use one of these methods: Place the tick in rubbing alcohol. Place it in a bag or container you can close tightly. Throw it away. Wrap it tightly in tape. Throw it away. Flush it down the toilet. Where to find more information Centers for Disease Control and Prevention: GyrateAtrophy.si U.S. Environmental Protection Agency: RelocationNetworking.fi Contact a doctor if: You have a fever or chills. You have a red rash that makes a circle (bull's-eye rash) in the bite area. You have redness and swelling where the tick bit you. You have a headache or stiff neck. You have pain in a muscle, joint, or bone. You are more tired than normal. You have trouble walking or moving your legs. You have numbness in your legs. You have tender or swollen lymph glands. You have belly (abdominal) pain, vomiting, watery poop (diarrhea), or weight loss. Get help right away if: You cannot remove a tick. You cannot move (have paralysis) or feel weak. You are feeling worse or have new  symptoms. You find a tick that is biting you and filled with blood, especially if you are in an area where diseases from ticks are common. Summary Ticks may carry germs that can make you sick. To prevent tick bites wear long sleeves, long pants, and light colors. Use insect repellent. Follow the instructions on the label. If the tick is biting, remove it right away. Do not try to remove it with heat, alcohol, petroleum jelly, or fingernail polish. Contact a doctor if you have symptoms of a disease after being bitten by a tick. This information is not intended to replace advice given to you by your health care provider. Make sure you discuss any questions you have with your health care provider. Document Revised: 06/01/2021 Document Reviewed: 06/01/2021 Elsevier Patient Education  2024 Elsevier Inc.   If you have been instructed to have an in-person evaluation today at a local Urgent Care facility, please use the link below. It will take you to a list  of all of our available [facility], including address, phone number and hours of operation. Please do not delay care.  [facility]  If you or a family member do not have a primary care provider, use the link below to schedule a visit and establish care. When you choose a [facility] primary care physician or advanced practice provider, you gain a long-term partner in health. Find a Primary Care Provider  Learn more about [facility]'s in-office and virtual care options: [facility] - Get Care Now

## 2023-07-15 ENCOUNTER — Other Ambulatory Visit (HOSPITAL_COMMUNITY): Payer: Self-pay

## 2023-07-15 ENCOUNTER — Other Ambulatory Visit: Payer: Self-pay

## 2023-07-15 MED ORDER — LISINOPRIL-HYDROCHLOROTHIAZIDE 10-12.5 MG PO TABS
1.0000 | ORAL_TABLET | Freq: Every day | ORAL | 3 refills | Status: AC
  Filled 2023-07-15: qty 90, 90d supply, fill #0
  Filled 2023-11-17: qty 90, 90d supply, fill #1

## 2023-08-23 ENCOUNTER — Other Ambulatory Visit (HOSPITAL_COMMUNITY): Payer: Self-pay

## 2023-08-23 ENCOUNTER — Ambulatory Visit (INDEPENDENT_AMBULATORY_CARE_PROVIDER_SITE_OTHER): Payer: Self-pay | Admitting: Adult Health

## 2023-08-23 ENCOUNTER — Encounter: Payer: Self-pay | Admitting: Adult Health

## 2023-08-23 DIAGNOSIS — F909 Attention-deficit hyperactivity disorder, unspecified type: Secondary | ICD-10-CM

## 2023-08-23 MED ORDER — AMPHETAMINE-DEXTROAMPHETAMINE 20 MG PO TABS
20.0000 mg | ORAL_TABLET | Freq: Every day | ORAL | 0 refills | Status: DC
  Filled 2023-08-23: qty 90, 90d supply, fill #0

## 2023-08-23 NOTE — Progress Notes (Signed)
 Robert [name] [id number] May 14, 1970 53 y.o.  Virtual Visit via Telephone Note  I connected with pt on 08/23/23 at  5:00 PM EDT by telephone and verified that I am speaking with the correct person using two identifiers.   I discussed the limitations, risks, security and privacy concerns of performing an evaluation and management service by telephone and the availability of in person appointments. I also discussed with the patient that there may be a patient responsible charge related to this service. The patient expressed understanding and agreed to proceed.   I discussed the assessment and treatment plan with the patient. The patient was provided an opportunity to ask questions and all were answered. The patient agreed with the plan and demonstrated an understanding of the instructions.   The patient was advised to call back or seek an in-person evaluation if the symptoms worsen or if the condition fails to improve as anticipated.  I provided 10 minutes of non-face-to-face time during this encounter.  The patient was located at home.  The provider was located at [facility].   Reagan Camera, NP   Subjective:   Patient ID:  Robert [name] is a 53 y.o. (DOB 12/20/1970) male.  Chief Complaint: No chief complaint on file.   HPI Robert [name] presents for follow-up of Describes mood today as "ok". Pleasant. Denies tearfulness. Mood symptoms - denies depression, anxiety and irritability. Reports stable interest and motivation. Denies panic attacks. Denies worry, rumination and over thinking. Reports mood as stable. Stating "I feel like I'm doing good". Taking medications as prescribes.  Enjoys some usual interests and activities. Active - exercising. Spending time with family. Appetite adequate. Weight stable. Sleeps well most nights. Averages 6 to 8 hours. Focus and concentration stable. Completing tasks. Managing aspects of household. Working full  time. Denies SI or HI.  Denies AH or VH. Denies self harm. Denies substance use.  Previous medication trials:  Adderall   Review of Systems:  Review of Systems  Musculoskeletal:  Negative for gait problem.  Neurological:  Negative for tremors.  Psychiatric/Behavioral:         Please refer to HPI    Medications: I have reviewed the patient's current medications.  Current Outpatient Medications  Medication Sig Dispense Refill   amphetamine -dextroamphetamine  (ADDERALL) 20 MG tablet Take 1 tablet (20 mg) by mouth daily. 90 tablet 0   guaifenesin  (HUMIBID E) 400 MG TABS tablet Take 1 tablet 3 times daily as needed for chest congestion and cough 21 tablet 0   ipratropium (ATROVENT ) 0.06 % nasal spray Place 2 sprays into both nostrils 4 times daily as needed for nasal congestion, runny nose 15 mL 0   lisdexamfetamine (VYVANSE ) 40 MG capsule TAKE 1 CAPSULE BY MOUTH ONCE DAILY IN THE MORNING. 90 capsule 0   lisinopril  (ZESTRIL ) 10 MG tablet Take 1 tablet (10 mg total) by mouth daily. 90 tablet 1   lisinopril -hydrochlorothiazide  (ZESTORETIC ) 10-12.5 MG tablet Take 1 tablet by mouth daily. 90 tablet 3   methylPREDNISolone  (MEDROL  DOSEPAK) 4 MG TBPK tablet Take 6 tablets by mouth on day 1, then decrease by 1 tablet daily until finished (6-5-4-3-2-1) 21 tablet 0   promethazine -dextromethorphan (PROMETHAZINE -DM) 6.25-15 MG/5ML syrup Take 5 mLs by mouth 4 times daily as needed for cough. 180 mL 0   No current facility-administered medications for this visit.    Medication Side Effects: None  Allergies: No Known Allergies  Past Medical History:  Diagnosis Date   ADHD  Family History  Problem Relation Age of Onset   High blood pressure Mother    Stroke Mother    High blood pressure Father    Heart disease Father    Heart disease Maternal Grandfather    High blood pressure Maternal Grandfather    Heart disease Paternal Grandfather    High blood pressure Paternal Grandfather      Social History   Socioeconomic History   Marital status: Married    Spouse name: Not on file   Number of children: Not on file   Years of education: Not on file   Highest education level: Not on file  Occupational History   Not on file  Tobacco Use   Smoking status: Never   Smokeless tobacco: Never  Substance and Sexual Activity   Alcohol use: Yes   Drug use: Never   Sexual activity: Yes    Partners: Female  Other Topics Concern   Not on file  Social History Narrative   Not on file   Social Drivers of Health   Financial Resource Strain: Not on file  Food Insecurity: Not on file  Transportation Needs: Not on file  Physical Activity: Not on file  Stress: Not on file  Social Connections: Unknown (07/30/2021)   Received from [facility], [facility]   Social Network    Social Network: Not on file  Intimate Partner Violence: Unknown (07/30/2021)   Received from [facility], [facility]   HITS    Physically Hurt: Not on file    Insult or Talk Down To: Not on file    Threaten Physical Harm: Not on file    Scream or Curse: Not on file    Past Medical History, Surgical history, Social history, and Family history were reviewed and updated as appropriate.   Please see review of systems for further details on the patient's review from today.   Objective:   Physical Exam:  There were no vitals taken for this visit.  Physical Exam Constitutional:      General: He is not in acute distress. Musculoskeletal:        General: No deformity.  Neurological:     Mental Status: He is alert and oriented to person, place, and time.     Coordination: Coordination normal.  Psychiatric:        Attention and Perception: Attention and perception normal. He does not perceive auditory or visual hallucinations.        Mood and Affect: Mood normal. Mood is not anxious or depressed. Affect is not labile, blunt, angry or inappropriate.        Speech: Speech normal.         Behavior: Behavior normal.        Thought Content: Thought content normal. Thought content is not paranoid or delusional. Thought content does not include homicidal or suicidal ideation. Thought content does not include homicidal or suicidal plan.        Cognition and Memory: Cognition and memory normal.        Judgment: Judgment normal.     Comments: Insight intact     Lab Review:     Component Value Date/Time   NA 135 12/26/2020 1417   K 3.6 12/26/2020 1417   CL 96 12/26/2020 1417   CO2 29 12/26/2020 1417   GLUCOSE 163 (H) 12/26/2020 1417   BUN 20 12/26/2020 1417   CREATININE 1.18 12/26/2020 1417   CREATININE 1.07 09/22/2018 1523   CALCIUM 9.7 12/26/2020 1417  PROT 7.0 09/22/2018 1523   AST 34 09/22/2018 1523   ALT 43 09/22/2018 1523   BILITOT 0.6 09/22/2018 1523       Component Value Date/Time   WBC 6.3 09/22/2018 1523   RBC 4.83 09/22/2018 1523   HGB 15.8 09/22/2018 1523   HCT 45.8 09/22/2018 1523   PLT 213 09/22/2018 1523   MCV 94.8 09/22/2018 1523   MCH 32.7 09/22/2018 1523   MCHC 34.5 09/22/2018 1523   RDW 13.1 09/22/2018 1523   LYMPHSABS 1,046 09/22/2018 1523   EOSABS 113 09/22/2018 1523   BASOSABS 32 09/22/2018 1523    No results found for: "POCLITH", "LITHIUM"   No results found for: "PHENYTOIN", "PHENOBARB", "VALPROATE", "CBMZ"   .res Assessment: Plan:    Plan:  PDMP reviewed   Adderall 20mg  daily  RTC 1 year  10 minutes spent dedicated to the care of this patient on the date of this encounter to include pre-visit review of records, ordering of medication, post visit documentation, and face-to-face time with the patient discussing ADD. Discussed continuing current medication regimen.  Discussed potential benefits, risks, and side effects of stimulants with patient to include increased heart rate, palpitations, insomnia, increased anxiety, increased irritability, or decreased appetite.  Instructed patient to contact office if experiencing any  significant tolerability issues.   Patient advised to contact office with any questions, adverse effects, or acute worsening in signs and symptoms.  Diagnoses and all orders for this visit:  Attention deficit hyperactivity disorder (ADHD), unspecified ADHD type -     amphetamine -dextroamphetamine  (ADDERALL) 20 MG tablet; Take 1 tablet (20 mg) by mouth daily.    Please see After Visit Summary for patient specific instructions.  No future appointments.  No orders of the defined types were placed in this encounter.     -------------------------------

## 2023-08-23 NOTE — Progress Notes (Deleted)
 Robert [name] [id number] [birth year] 53 y.o.  Virtual Visit via Video Note  I connected with pt @ on 08/23/23 at  5:30 PM EDT by a video enabled telemedicine application and verified that I am speaking with the correct person using two identifiers.   I discussed the limitations of evaluation and management by telemedicine and the availability of in person appointments. The patient expressed understanding and agreed to proceed.  I discussed the assessment and treatment plan with the patient. The patient was provided an opportunity to ask questions and all were answered. The patient agreed with the plan and demonstrated an understanding of the instructions.   The patient was advised to call back or seek an in-person evaluation if the symptoms worsen or if the condition fails to improve as anticipated.  I provided 10 minutes of non-face-to-face time during this encounter.  The patient was located at home.  The provider was located at [facility].   Reagan Camera, NP   Subjective:   Patient ID:  Robert [name] is a 53 y.o. (DOB [birth year]) male.  Chief Complaint: No chief complaint on file.   HPI EESHAN VERBRUGGE presents for follow-up of ADD.  Describes mood today as "ok". Pleasant. Denies tearfulness. Mood symptoms - denies depression, anxiety and irritability. Reports stable interest and motivation. Denies panic attacks. Denies worry, rumination and over thinking. Reports mood as stable. Stating "I feel like I'm doing good". Taking medications as prescribes.  Enjoys some usual interests and activities. Active - exercising. Spending time with family. Appetite adequate. Weight stable. Sleeps well most nights. Averages 6 to 8 hours. Focus and concentration stable. Completing tasks. Managing aspects of household. Working full time. Denies SI or HI.  Denies AH or VH. Denies self harm. Denies substance use.  Previous medication trials:   Addrall   Review of Systems:  Review of Systems  Musculoskeletal:  Negative for gait problem.  Neurological:  Negative for tremors.  Psychiatric/Behavioral:         Please refer to HPI    Medications: I have reviewed the patient's current medications.  Current Outpatient Medications  Medication Sig Dispense Refill   amphetamine -dextroamphetamine  (ADDERALL) 20 MG tablet Take 1 tablet (20 mg) by mouth daily. 90 tablet 0   guaifenesin  (HUMIBID E) 400 MG TABS tablet Take 1 tablet 3 times daily as needed for chest congestion and cough 21 tablet 0   ipratropium (ATROVENT ) 0.06 % nasal spray Place 2 sprays into both nostrils 4 times daily as needed for nasal congestion, runny nose 15 mL 0   lisdexamfetamine (VYVANSE ) 40 MG capsule TAKE 1 CAPSULE BY MOUTH ONCE DAILY IN THE MORNING. 90 capsule 0   lisinopril  (ZESTRIL ) 10 MG tablet Take 1 tablet (10 mg total) by mouth daily. 90 tablet 1   lisinopril -hydrochlorothiazide  (ZESTORETIC ) 10-12.5 MG tablet Take 1 tablet by mouth daily. 90 tablet 3   methylPREDNISolone  (MEDROL  DOSEPAK) 4 MG TBPK tablet Take 6 tablets by mouth on day 1, then decrease by 1 tablet daily until finished (6-5-4-3-2-1) 21 tablet 0   promethazine -dextromethorphan (PROMETHAZINE -DM) 6.25-15 MG/5ML syrup Take 5 mLs by mouth 4 times daily as needed for cough. 180 mL 0   No current facility-administered medications for this visit.    Medication Side Effects: None  Allergies: No Known Allergies  Past Medical History:  Diagnosis Date   ADHD     Family History  Problem Relation Age of Onset   High blood pressure Mother    Stroke Mother  High blood pressure Father    Heart disease Father    Heart disease Maternal Grandfather    High blood pressure Maternal Grandfather    Heart disease Paternal Grandfather    High blood pressure Paternal Grandfather     Social History   Socioeconomic History   Marital status: Married    Spouse name: Not on file   Number of  children: Not on file   Years of education: Not on file   Highest education level: Not on file  Occupational History   Not on file  Tobacco Use   Smoking status: Never   Smokeless tobacco: Never  Substance and Sexual Activity   Alcohol use: Yes   Drug use: Never   Sexual activity: Yes    Partners: Female  Other Topics Concern   Not on file  Social History Narrative   Not on file   Social Drivers of Health   Financial Resource Strain: Not on file  Food Insecurity: Not on file  Transportation Needs: Not on file  Physical Activity: Not on file  Stress: Not on file  Social Connections: Unknown (07/30/2021)   Received from [facility], [facility]   Social Network    Social Network: Not on file  Intimate Partner Violence: Unknown (07/30/2021)   Received from [facility], [facility]   HITS    Physically Hurt: Not on file    Insult or Talk Down To: Not on file    Threaten Physical Harm: Not on file    Scream or Curse: Not on file    Past Medical History, Surgical history, Social history, and Family history were reviewed and updated as appropriate.   Please see review of systems for further details on the patient's review from today.   Objective:   Physical Exam:  There were no vitals taken for this visit.  Physical Exam Constitutional:      General: He is not in acute distress. Musculoskeletal:        General: No deformity.  Neurological:     Mental Status: He is alert and oriented to person, place, and time.     Coordination: Coordination normal.  Psychiatric:        Attention and Perception: Attention and perception normal. He does not perceive auditory or visual hallucinations.        Mood and Affect: Mood normal. Mood is not anxious or depressed. Affect is not labile, blunt, angry or inappropriate.        Speech: Speech normal.        Behavior: Behavior normal.        Thought Content: Thought content normal. Thought content is not paranoid or  delusional. Thought content does not include homicidal or suicidal ideation. Thought content does not include homicidal or suicidal plan.        Cognition and Memory: Cognition and memory normal.        Judgment: Judgment normal.     Comments: Insight intact     Lab Review:     Component Value Date/Time   NA 135 12/26/2020 1417   K 3.6 12/26/2020 1417   CL 96 12/26/2020 1417   CO2 29 12/26/2020 1417   GLUCOSE 163 (H) 12/26/2020 1417   BUN 20 12/26/2020 1417   CREATININE 1.18 12/26/2020 1417   CREATININE 1.07 09/22/2018 1523   CALCIUM 9.7 12/26/2020 1417   PROT 7.0 09/22/2018 1523   AST 34 09/22/2018 1523   ALT 43 09/22/2018 1523   BILITOT 0.6  09/22/2018 1523       Component Value Date/Time   WBC 6.3 09/22/2018 1523   RBC 4.83 09/22/2018 1523   HGB 15.8 09/22/2018 1523   HCT 45.8 09/22/2018 1523   PLT 213 09/22/2018 1523   MCV 94.8 09/22/2018 1523   MCH 32.7 09/22/2018 1523   MCHC 34.5 09/22/2018 1523   RDW 13.1 09/22/2018 1523   LYMPHSABS 1,046 09/22/2018 1523   EOSABS 113 09/22/2018 1523   BASOSABS 32 09/22/2018 1523    No results found for: "POCLITH", "LITHIUM"   No results found for: "PHENYTOIN", "PHENOBARB", "VALPROATE", "CBMZ"   .res Assessment: Plan:    Plan:  PDMP reviewed   Adderall 20mg  daily  RTC 1 year  10 minutes spent dedicated to the care of this patient on the date of this encounter to include pre-visit review of records, ordering of medication, post visit documentation, and face-to-face time with the patient discussing ADD. Discussed continuing current medication regimen.  Patient advised to contact office with any questions, adverse effects, or acute worsening in signs and symptoms.  Adderall 20mg  daily  There are no diagnoses linked to this encounter.   Please see After Visit Summary for patient specific instructions.  Future Appointments  Date Time Provider Department Center  08/23/2023  5:30 PM Aja Bolander Nattalie, NP [facility]  None    No orders of the defined types were placed in this encounter.     -------------------------------

## 2023-08-26 ENCOUNTER — Other Ambulatory Visit (HOSPITAL_COMMUNITY): Payer: Self-pay

## 2023-10-24 ENCOUNTER — Telehealth: Payer: Self-pay | Admitting: Physician Assistant

## 2023-10-24 NOTE — Telephone Encounter (Signed)
 Copied from CRM [id number]. Topic: Appointments - Transfer of Care >> Oct 24, 2023  2:27 PM Viola F wrote: Pt is requesting to transfer FROM: Robert [name] Pt is requesting to transfer TO: Dr. Jesus  Reason for requested transfer: Patient would like to transfer to male provider  It is the responsibility of the team the patient would like to transfer to (Dr. Jesus) to reach out to the patient if for any reason this transfer is not acceptable.

## 2023-11-17 ENCOUNTER — Other Ambulatory Visit (HOSPITAL_COMMUNITY): Payer: Self-pay

## 2023-11-17 ENCOUNTER — Other Ambulatory Visit: Payer: Self-pay

## 2023-11-17 MED ORDER — LISINOPRIL 10 MG PO TABS
10.0000 mg | ORAL_TABLET | Freq: Every day | ORAL | 1 refills | Status: DC
  Filled 2023-11-17: qty 90, 90d supply, fill #0

## 2023-11-28 ENCOUNTER — Other Ambulatory Visit (HOSPITAL_COMMUNITY): Payer: Self-pay

## 2023-12-07 ENCOUNTER — Encounter: Admitting: Internal Medicine

## 2023-12-17 ENCOUNTER — Other Ambulatory Visit (HOSPITAL_COMMUNITY): Payer: Self-pay

## 2023-12-19 ENCOUNTER — Other Ambulatory Visit: Payer: Self-pay | Admitting: Adult Health

## 2023-12-19 ENCOUNTER — Other Ambulatory Visit (HOSPITAL_COMMUNITY): Payer: Self-pay

## 2023-12-19 ENCOUNTER — Other Ambulatory Visit: Payer: Self-pay

## 2023-12-19 DIAGNOSIS — F909 Attention-deficit hyperactivity disorder, unspecified type: Secondary | ICD-10-CM

## 2023-12-19 MED ORDER — AMPHETAMINE-DEXTROAMPHETAMINE 20 MG PO TABS
20.0000 mg | ORAL_TABLET | Freq: Every day | ORAL | 0 refills | Status: AC
  Filled 2023-12-19: qty 50, 50d supply, fill #0
  Filled 2023-12-19: qty 40, 40d supply, fill #0

## 2024-03-30 ENCOUNTER — Telehealth: Payer: Self-pay

## 2024-03-30 ENCOUNTER — Other Ambulatory Visit (HOSPITAL_COMMUNITY): Payer: Self-pay

## 2024-03-30 ENCOUNTER — Other Ambulatory Visit: Payer: Self-pay

## 2024-03-30 MED ORDER — LISINOPRIL 10 MG PO TABS
10.0000 mg | ORAL_TABLET | Freq: Every day | ORAL | 1 refills | Status: AC
  Filled 2024-03-30: qty 90, 90d supply, fill #0
